# Patient Record
Sex: Female | Born: 1970 | Race: White | Hispanic: No | Smoking: Former smoker
Health system: Southern US, Community
[De-identification: ages and names within clinical notes are randomized; demographics above are authoritative.]

## PROBLEM LIST (undated history)

## (undated) DIAGNOSIS — O1423 HELLP syndrome (HELLP), third trimester: Secondary | ICD-10-CM

## (undated) DIAGNOSIS — O24419 Gestational diabetes mellitus in pregnancy, unspecified control: Secondary | ICD-10-CM

## (undated) HISTORY — DX: HELLP syndrome (HELLP), third trimester: O14.23

## (undated) HISTORY — DX: Gestational diabetes mellitus in pregnancy, unspecified control: O24.419

---

## 2006-11-15 ENCOUNTER — Inpatient Hospital Stay (HOSPITAL_COMMUNITY): Admission: AD | Admit: 2006-11-15 | Discharge: 2006-11-19 | Payer: Self-pay | Admitting: Obstetrics and Gynecology

## 2006-12-20 ENCOUNTER — Ambulatory Visit: Payer: Self-pay | Admitting: Obstetrics & Gynecology

## 2006-12-27 ENCOUNTER — Ambulatory Visit: Payer: Self-pay | Admitting: Obstetrics & Gynecology

## 2007-01-03 ENCOUNTER — Ambulatory Visit: Payer: Self-pay | Admitting: Gynecology

## 2007-01-10 ENCOUNTER — Ambulatory Visit: Payer: Self-pay | Admitting: Obstetrics and Gynecology

## 2007-01-14 ENCOUNTER — Inpatient Hospital Stay (HOSPITAL_COMMUNITY): Admission: AD | Admit: 2007-01-14 | Discharge: 2007-01-18 | Payer: Self-pay | Admitting: Obstetrics and Gynecology

## 2007-01-15 ENCOUNTER — Encounter (INDEPENDENT_AMBULATORY_CARE_PROVIDER_SITE_OTHER): Payer: Self-pay | Admitting: Obstetrics and Gynecology

## 2007-01-31 ENCOUNTER — Ambulatory Visit: Admission: RE | Admit: 2007-01-31 | Discharge: 2007-01-31 | Payer: Self-pay | Admitting: Obstetrics and Gynecology

## 2008-04-08 IMAGING — US US OB TRANSVAGINAL
1 series · 14 of 16 positions shown · non-contrast
Comparison: none

OBSTETRICAL ULTRASOUND:

 This ultrasound exam was performed in the [HOSPITAL] Ultrasound Department.  The OB US report was generated in the AS system, and faxed to the ordering physician.  This report is also available in [REDACTED] PACS.

[Series 1: us ob transvaginal · 0.35mm/px · 14 of 16 slices shown]
[im 1/16]
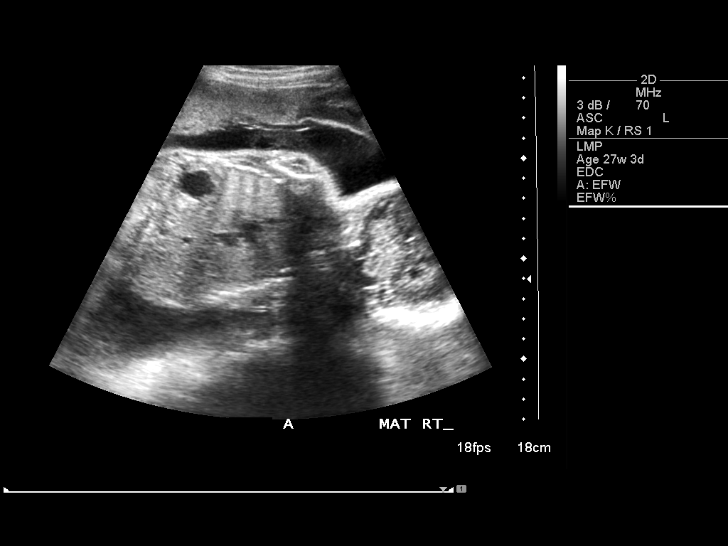
[im 2/16]
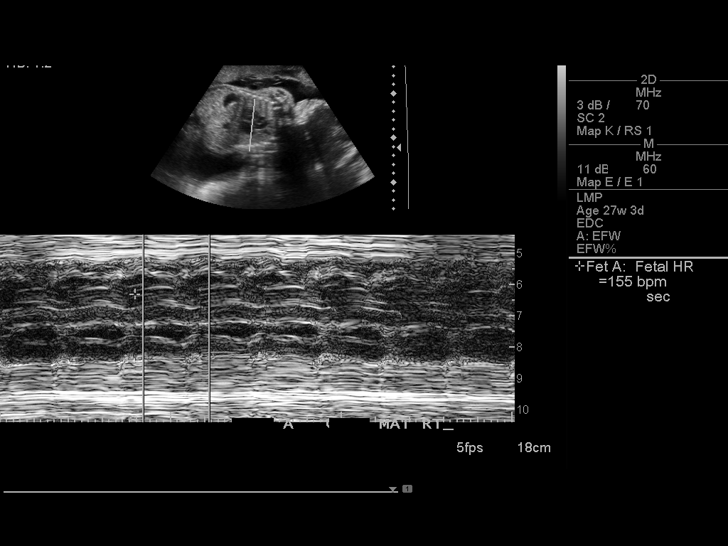
[im 3/16]
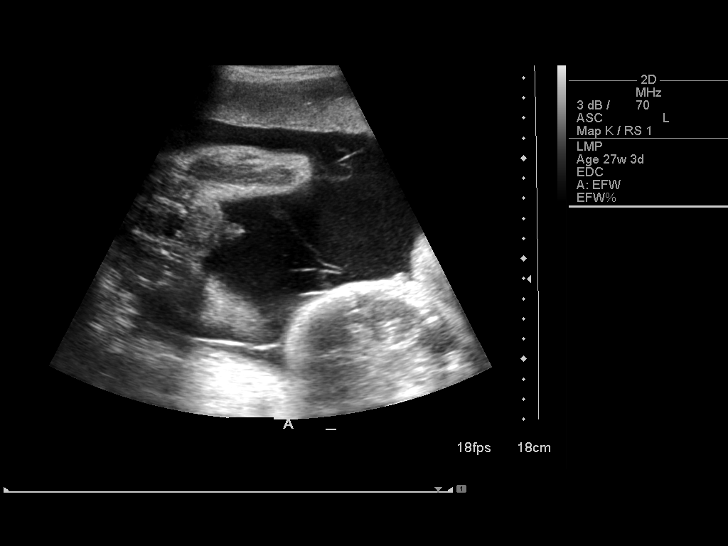
[im 5/16]
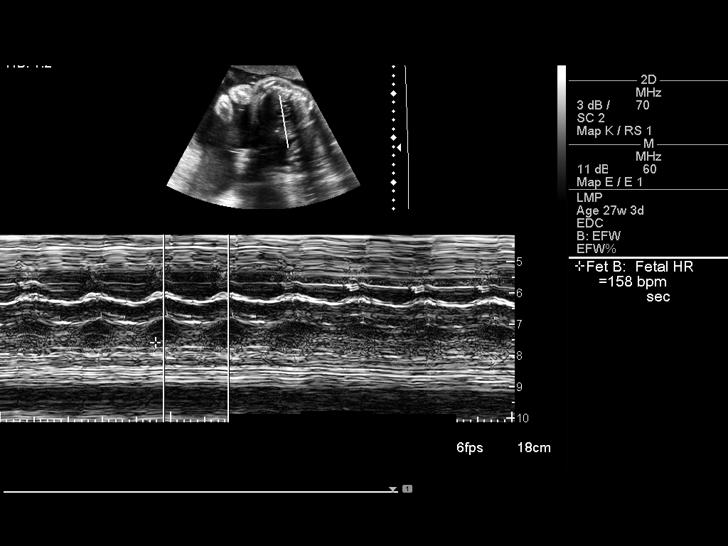
[im 6/16]
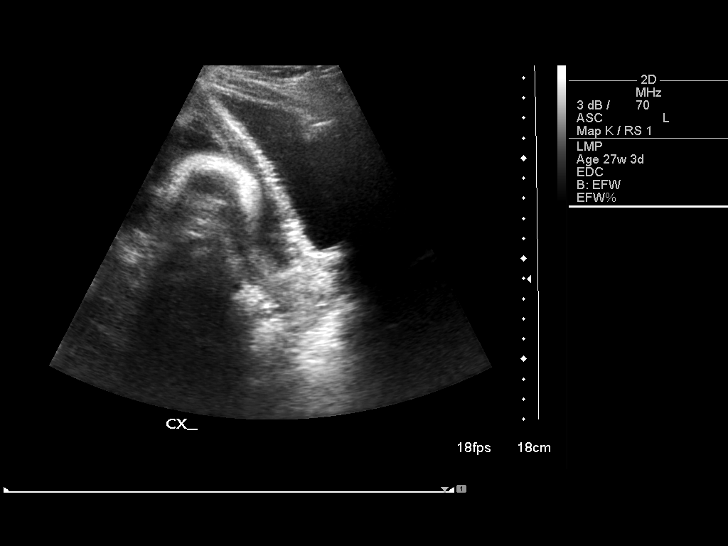
[im 7/16]
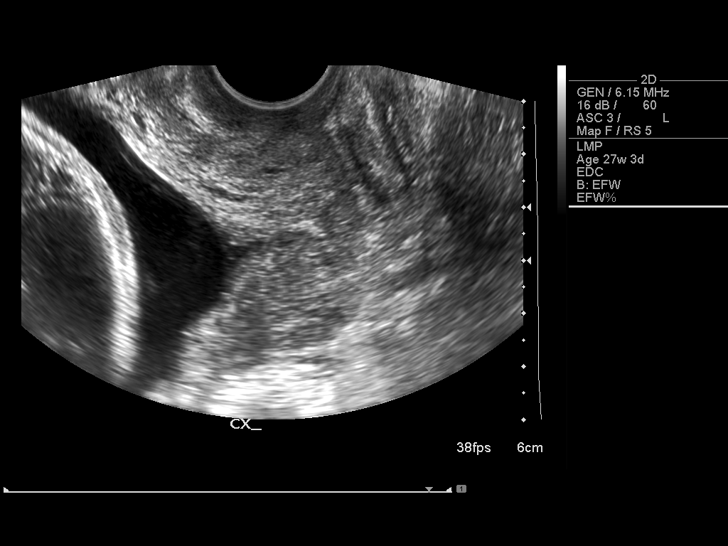
[im 8/16]
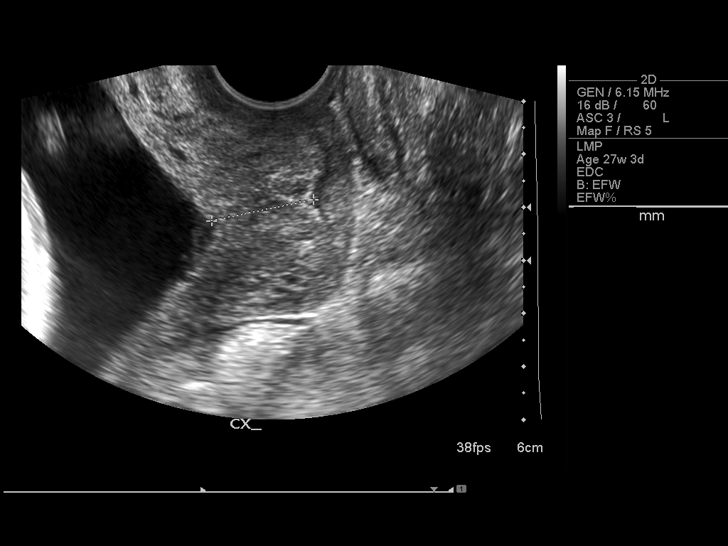
[im 9/16]
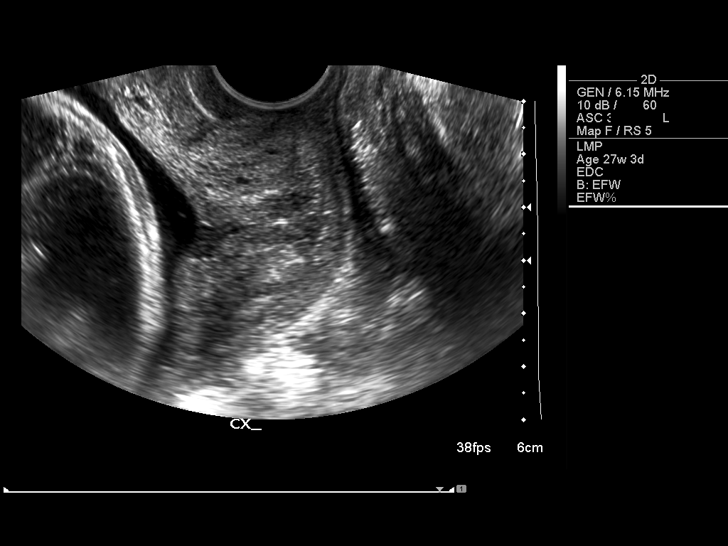
[im 10/16]
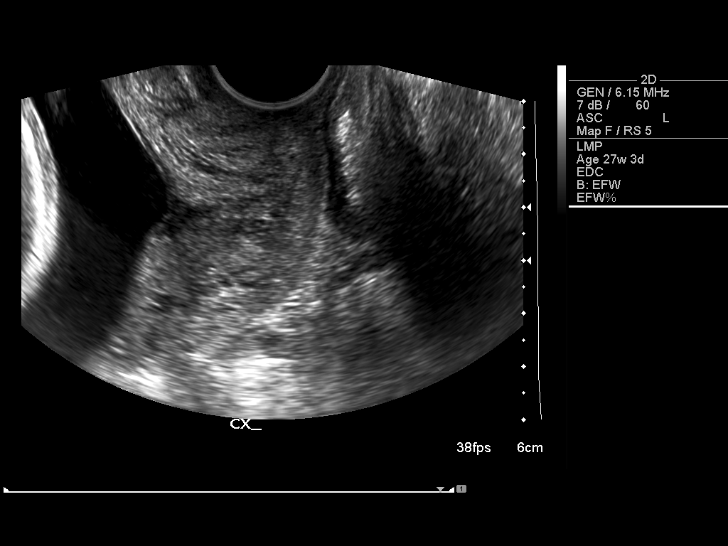
[im 11/16]
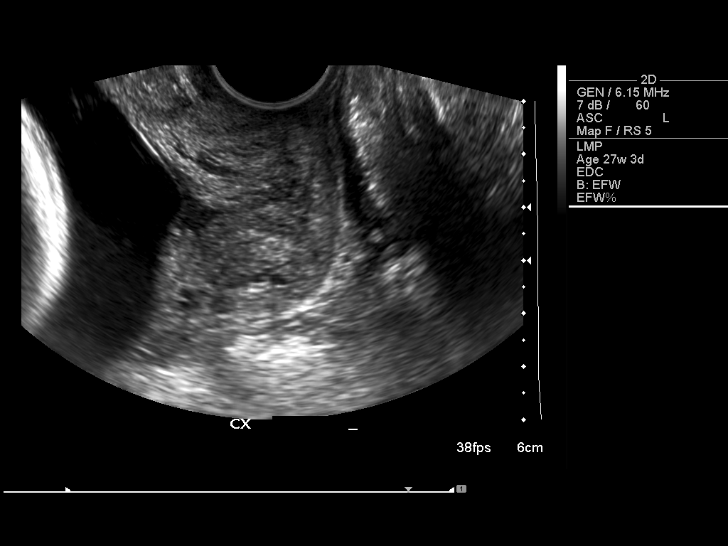
[im 13/16]
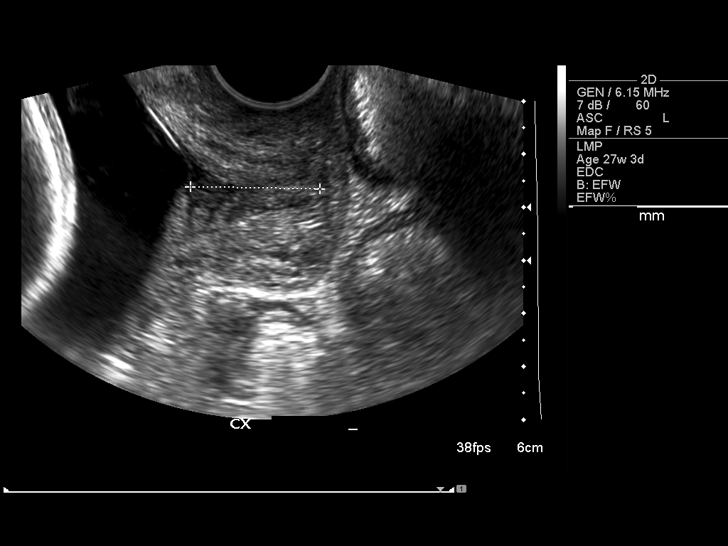
[im 14/16]
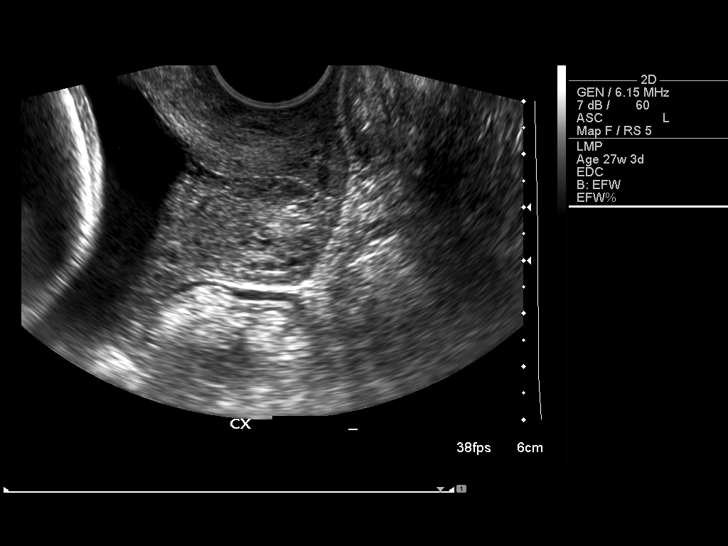
[im 15/16]
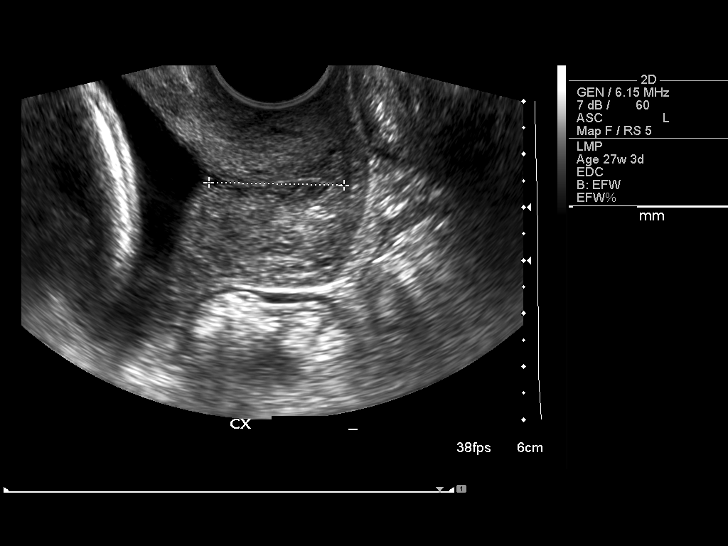
[im 16/16]
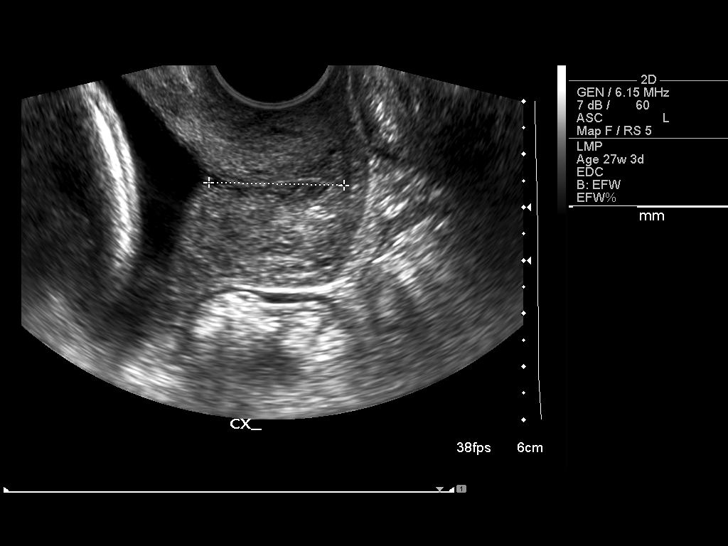

[14 of 16 positions shown; findings below may reference images not displayed]

IMPRESSION: See AS Obstetric US report.

## 2010-10-13 NOTE — Discharge Summary (Signed)
NAME:  Christina Lambert, Christina Lambert            ACCOUNT NO.:  [id number]   MEDICAL RECORD NO.:  [id number]          PATIENT TYPE:  INP   LOCATION:  9158                          FACILITY:  [facility]   PHYSICIAN:  Michelle L. Grewal, M.D.DATE OF BIRTH:  May 31, 1971   DATE OF ADMISSION:  11/15/2006  DATE OF DISCHARGE:  11/19/2006                               DISCHARGE SUMMARY   ADMITTING DIAGNOSES:  1. Intrauterine pregnancy at 27-3/7 weeks, estimated gestational age.  2. Twin gestation.  3. Pre-term labor.   DISCHARGE DIAGNOSES:  1. Intrauterine pregnancy at 27-5/7 weeks estimated gestational age.  2. Twin gestation.  3. Pre-term labor, tocolysis.   REASON FOR ADMISSION:  Please see dictated H&P.   HOSPITAL COURSE:  The patient was a 40 year old gravida 2, para 0-0-1-0,  that was admitted to [facility] at 27-3/7 weeks  estimated gestational age with a twin gestation.  The patient had been  seen in the office for routine visit and was noted that the external os  was dilated to 2-3 cm.  The patient was then sent to the maternity  admissions for further monitoring.  On the fetal monitor, the patient  was noted to have some increase in uterine activity.  She was  administered 0.25 mg of terbutaline subcutaneously, while waiting to get  cervical length assessment.  Cervical length was noted to be 2.5 cm,  which was decreased from the previous ultrasound scanning that had  revealed cervix at 5.1 cm.  The patient was now admitted for observation  and tocolysis and betamethasone administration.  On the following  morning, the patient was doing well.  No further contractions were noted  on the monitor.  Fetal movement was observed, and fetal heart tones were  reactive.  Ultrasound had revealed vertex breech presentation.  Decision  was made to perform fetal fibronectin, continue with betamethasone  administration, if no further contractions.  Plan was to discharge the  patient home.  On the  following morning, the patient was without  complaint.  She was able to feel some uterine contractions.  Vital signs  were stable.  Fetal heart tones were reactive.  Contractions:  None had  been seen throughout the night.  However, the patient had been observed  10 over the last several hours.  Group B beta strep culture had been  performed.  However, we were waiting for results.  The patient was  completed.  Betamethasone administration was changed from terbutaline to  Procardia, due to some was associated tachycardia.  One-hour GTT was  performed which was abnormal, and now 3-hour GTT was scheduled.  On the  following morning, the patient was doing well, no contractions were  observed, fetal heart tones were reactive, uterus was nontender.  A 3-  hour glucose tolerance test was scheduled.  On the following morning,  the patient was without complaint.  Vital signs are stable.  Fetal heart  tones were reactive.  No further contractions were noted, and the  patient was later discharged home.   CONDITION ON DISCHARGE:  Stable.   DIET:  Regular as tolerated.   FOLLOW UP:  Patient to follow up in the office in 2 to 3 days for an OB  check.  She is to call for increase in pelvic pressure contractions,  rupture of membranes, vaginal bleeding or decrease in fetal movement.   DISCHARGE MEDICATIONS:  1. Procardia 10 mg every 6 hours.  2. Ambien 10 mg one p.o. at h.s. p.r.n.  3. Prenatal vitamins one p.o. daily.      Julio Sicks, N.P.      Stann Mainland. Vincente Poli, M.D.  Electronically Signed    CC/MEDQ  D:  12/23/2006  T:  12/23/2006  Job:  [id number]

## 2010-10-13 NOTE — Op Note (Signed)
NAME:  Christina Lambert, Christina Lambert NO.:  [id number]   MEDICAL RECORD NO.:  [id number]          PATIENT TYPE:  INP   LOCATION:  9374                          FACILITY:  [facility]   PHYSICIAN:  Juluis Mire, M.D.   DATE OF BIRTH:  09-26-1970   DATE OF PROCEDURE:  01/15/2007  DATE OF DISCHARGE:  11/19/2006                               OPERATIVE REPORT   PREOPERATIVE DIAGNOSIS:  Twin pregnancy 36 weeks with a development of  HELLP syndrome.  The infant's in the vertex transverse lie.   POSTOPERATIVE DIAGNOSIS:  Twin pregnancy 36 weeks with a development of  HELLP syndrome.  The infant's in the vertex transverse lie.   PROCEDURES:  Primary low transverse cesarean section.   SURGEON:  Juluis Mire, M.D.   ANESTHESIA:  Spinal.   ESTIMATED BLOOD LOSS:  700 mL.   PACKS AND DRAINS:  None.   BLOOD REPLACED:  None.   COMPLICATIONS:  None.   INDICATIONS:  A 40 year old female with twin pregnancies at 54 weeks.  She reported on the evening of 16th with bleeding after intercourse.  Evaluation revealed minimal bleeding but enough bloody show that she was  kept overnight. She had no progressive uterine activity. A CBC was  obtained which revealed a low platelet count of 107,000.  It was  repeated and did drop to 90,000.  We did have PIH panel that revealed  elevated SGOT, SGPT and LDH consistent with HELLP syndrome.  In view of  this, ultrasound was performed.  The infant's were the vertex transverse  lie.  In view of the lowering platelet count and the HELLP syndrome with  the fetal position, decision was to proceed with primary cesarean  section.  The risks were explained including the risk of infection.  The  risk of hemorrhage that could require transfusion with the risk of AIDS  or hepatitis.  The risk of injury to adjacent organs including bladder,  bowel, ureters that could require further exploratory surgery.  Risk of  deep venous thrombosis and pulmonary embolus.  The risk  of fetal  pulmonary immaturity also explained.   DESCRIPTION OF PROCEDURE:  The patient was taken to OR, placed supine  position with left lateral tilt.  After satisfactory level spinal  anesthesia was obtained, the abdomen was prepped out Betadine and draped  as a sterile field.  A low transverse skin incision made knife and  carried through subcutaneous tissue.  Fascia was entered sharply,  incision fascia extended laterally.  Fascia taken off the muscle  superiorly inferiorly.  Rectus muscles were separated midline.  The  anterior peritoneum was identified and entered sharply, incision of  peritoneum extended both superiorly inferiorly.  A low transverse  bladder flap was developed, a low transverse uterine incision begun with  a knife and extended laterally using manual traction. Twin A presented  vertex presentation and had clear amniotic fluid, was delivered  elevation head and fundal pressure. The infant was viable female weighing  5 pounds 15 ounces Apgars were eight and nine, umbilical artery pH was  7.35.  Twin B was transverse was converted to  a vertex presentation and  the amniotic fluid was clear and was delivered elevation of the head and  fundal pressure and was a viable female weighing 5 pounds 4 ounces Apgars  were 8/9 umbilical artery pH was 7.31.  Placenta was delivered manually  and sent for pathological review.  Uterus then closed with running  locking suture of 0 chromic using a two-layer closure technique.  We had  good hemostasis.  Tubes and ovaries were unremarkable.  We thoroughly  irrigated the pelvis and again had good hemostasis.  Muscles and  peritoneum closed en bloc with a running suture of 3-0 Vicryl.  Fascia  closed with a suture of 0 PDS, subcu with a running suture of 2-0  chromic.  Skin was closed staples and Steri-Strips.  Sponge, instrument  and needle count was correct by circulating nurse x2.  Foley catheter  remained clear at the time of closure.   The patient did tolerate the  procedure well and was returned to the recovery room in good condition.      Juluis Mire, M.D.  Electronically Signed     JSM/MEDQ  D:  01/15/2007  T:  01/15/2007  Job:  [id number]

## 2010-10-13 NOTE — H&P (Signed)
NAME:  Christina Lambert, Christina Lambert NO.:  [id number]   MEDICAL RECORD NO.:  [id number]          PATIENT TYPE:  INP   LOCATION:  9158                          FACILITY:  [facility]   PHYSICIAN:  Duke Salvia. Marcelle Overlie, M.D.DATE OF BIRTH:  Jun 12, 1970   DATE OF ADMISSION:  11/15/2006  DATE OF DISCHARGE:                              HISTORY & PHYSICAL   CHIEF COMPLAINT:  Preterm labor.   HISTORY OF PRESENT ILLNESS:  A 40 year old G2, P0-0-1-0, at 27-3/7  weeks, who was seen in the office today for a routine visit and it was  noted that the external os was 2-3 cm dilated.  Was sent to [facility] for  monitoring, where she did so with some increased uterine activity.  She  was given 0.25 mg of terbutaline subcutaneously while we were waiting to  get a cervical length assessment.  FFN was not done due to the digital  exam.  Cervical length Oct 06, 2006, was 5.1, today was 2.5 cm.  The  decision made to admit for observation and tocolysis along with  betamethasone.   Blood type is A positive.  Her one-hour GTT has not been done yet.  Her  last ultrasound she was being followed for a low-lying placenta.   PAST MEDICAL HISTORY:  Allergies:  None.   Obstetrical history:  One TAB in 1995.   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   FAMILY HISTORY:  Please see her [facility] form.   PHYSICAL EXAMINATION:  VITAL SIGNS:  Temperature 98.3, blood pressure  120/78.  HEENT:  Unremarkable.  NECK:  Supple without masses.  LUNGS:  Clear.  CARDIOVASCULAR:  Regular rate and rhythm without murmurs, rubs or  gallops noted.  BREASTS:  Not examined.  ABDOMEN:  Fundal height was 36 cm.  PELVIC:  Cervix:  External os 2-3, internal os closed.  EXTREMITIES:  Unremarkable.  NEUROLOGIC:  Unremarkable.   IMPRESSION:  1. Twenty-seven and three-sevenths week twin gestation.  2. Low-lying placenta.  3. Preterm labor.   PLAN:  We will admit for betamethasone and  tocolysis.      Richard M. Marcelle Overlie, M.D.  Electronically Signed     RMH/MEDQ  D:  11/15/2006  T:  11/15/2006  Job:  [id number]

## 2010-10-16 NOTE — Discharge Summary (Signed)
NAME:  Christina Lambert, Christina Lambert NO.:  [id number]   MEDICAL RECORD NO.:  [id number]          PATIENT TYPE:  INP   LOCATION:                                FACILITY:  [facility]   PHYSICIAN:  Zelphia Cairo, MD    DATE OF BIRTH:  01-Apr-1971   DATE OF ADMISSION:  01/14/2007  DATE OF DISCHARGE:  01/18/2007                               DISCHARGE SUMMARY   ADMITTING DIAGNOSES:  Intrauterine pregnancy at 51 weeks, estimated  gestational age.  1. Twin gestation.  2. HELLP syndrome.   DISCHARGE DIAGNOSES:  1. Status post low transverse cesarean section.  2. Viable female twin infants.  3. HELLP syndrome, resolved.   PROCEDURE:  Primary low transverse cesarean section.   REASON FOR ADMISSION:  Please see written H&P.   HOSPITAL COURSE:  The patient is 40 year old female that was admitted to  [facility] at 36 weeks estimated gestational age with  known twin gestation.  The patient had reported on the evening of the  August 16, she had some bleeding after intercourse.  Evaluation revealed  minimal bleeding, however, enough bloody show that she was kept  overnight for observation.  The patient had no progressive uterine  activity.  A CBC was obtained which had revealed a low platelet count of  107,000.  The patient did have a CBC repeated which revealed lowering of  platelet count down to 90,000.  PIH panel was drawn which had revealed  an elevated SGOT and SGPT and LDH which was consistent with HELLP  syndrome.  In view of this, decision was made to proceed with a primary  low transverse cesarean section.  The patient was then taken to the  operating room where spinal anesthesia was administered without  difficulty.  A low transverse incision was made with delivery of a  viable twin A female infant weighing 5 pounds 14 ounces with Apgars of 9  at 1 minute and 9 at 5 minutes.  Arterial cord pH was 7.34.  Also, twin  B, also female, weighing 5 pounds 5 ounces with Apgars of  9 at 1 minute  and 9 at 5 minutes.  Arterial cord pH was 7.31.  The patient was  transferred to the recovery room in stable condition.  The patient was  started on magnesium sulfate.  Blood pressure was 130s/60s to 80s,  output had been approximately 250 mL over the last several hours.  Deep  tendon reflexes were 2+.  Fundus was firm and nontender.  Abdominal  dressing was noted to be clean, dry and intact.  Platelet count was up  to 75,000 and liver function tests seemed to be decreasing.  The patient  continued on magnesium sulfate and PIH.  Labs were ordered for later in  the evening.  Later that evening, the patient did complained of some  blurred vision.  Vital signs were stable.  Blood pressure stable.  Liver  function tests were improving.  Output was now 250-650 mL per hour.  The  patient was noted to be diuresing and magnesium sulfate was discontinued  and PIH labs were ordered  for the following morning.  On postoperative  day #2, the patient denied any headache or blurred vision or right upper  quadrant pain.  Vital signs were stable.  Blood pressure was within  normal limits.  Lungs were clear to auscultation.  Abdomen: Soft.  Fundus firm and nontender.  Deep tendon reflexes were 3+ without clonus.  Laboratory findings revealed hemoglobin of 8.3, platelet count was up to  117,000, creatinine was 0.98, AST was now 58, ALT was 92.  Uric acid was  9.0.  The patient was later transferred to the mother baby unit.  On  postoperative day #3, the patient denied headache, blurred vision or  right upper quadrant pain.  Vital signs were stable.  She was afebrile.  Blood pressure was 119/81 to 124/83.  Deep tendon reflexes 2+.  Abdomen  soft.  Fundus firm and nontender.  Abdominal dressing was removed  revealing incision that was clean, dry and intact.  Staples were  subsequently removed.  Laboratory findings showed hemoglobin of 8.1,  platelet count 137,000, WBC count of 11.4, AST was 48,  ALT was 69,  alkaline phosphatase was 107.  Discharge instructions were reviewed and  the patient was later discharged home.   CONDITION ON DISCHARGE:  Stable.   DISCHARGE INSTRUCTIONS:  1. Diet regular as tolerated.  2. Activity:  No heavy lifting, no driving x2 weeks, no vaginal entry.   FOLLOW UP:  Patient to follow up in the office in 2 days for blood  pressure check and PIH labs.  She is to call for temperature greater  than 100 degrees, persistent nausea and vomiting, heavy vaginal bleeding  and/or redness or drainage from incisional site.  The patient is also  encouraged to call for headache, blurred vision or right upper quadrant  pain.   DISCHARGE MEDICATIONS:  1. Percocet 5/325 #30 one p.o. q.4-6 h.  2. Motrin 600 mg q.6 h.  3. Prenatal vitamins one p.o. daily.  4. Colace 1 p.o. daily p.r.n.      Julio Sicks, N.P.      Zelphia Cairo, MD  Electronically Signed    CC/MEDQ  D:  02/22/2007  T:  02/22/2007  Job:  [id number]

## 2011-03-12 LAB — COMPREHENSIVE METABOLIC PANEL
ALT: 127 — ABNORMAL HIGH
ALT: 183 — ABNORMAL HIGH
ALT: 211 — ABNORMAL HIGH
AST: 122 — ABNORMAL HIGH
AST: 166 — ABNORMAL HIGH
AST: 92 — ABNORMAL HIGH
Albumin: 1.7 — ABNORMAL LOW
Albumin: 1.7 — ABNORMAL LOW
Albumin: 1.8 — ABNORMAL LOW
Albumin: 2.1 — ABNORMAL LOW
Alkaline Phosphatase: 107
Alkaline Phosphatase: 129 — ABNORMAL HIGH
Alkaline Phosphatase: 146 — ABNORMAL HIGH
BUN: 5 — ABNORMAL LOW
BUN: 5 — ABNORMAL LOW
BUN: 6
BUN: 7
CO2: 21
Calcium: 7.5 — ABNORMAL LOW
Calcium: 8 — ABNORMAL LOW
Calcium: 8.3 — ABNORMAL LOW
Calcium: 8.5
Chloride: 100
Chloride: 104
Chloride: 109
Creatinine, Ser: 0.84
Creatinine, Ser: 0.97
Creatinine, Ser: 0.98
Creatinine, Ser: 1
Creatinine, Ser: 1.11
GFR calc Af Amer: >60
GFR calc Af Amer: >60
GFR calc Af Amer: >60
GFR calc non Af Amer: >60
Glucose, Bld: 80
Glucose, Bld: 97
Potassium: 3.6
Potassium: 3.7
Potassium: 3.9
Sodium: 133 — ABNORMAL LOW
Sodium: 135
Total Bilirubin: 0.5
Total Bilirubin: 0.6
Total Bilirubin: 0.8
Total Bilirubin: 0.8
Total Protein: 4 — ABNORMAL LOW
Total Protein: 4.3 — ABNORMAL LOW
Total Protein: 4.3 — ABNORMAL LOW
Total Protein: 4.5 — ABNORMAL LOW
Total Protein: 5 — ABNORMAL LOW

## 2011-03-12 LAB — CBC
HCT: 23.4 — ABNORMAL LOW
HCT: 24 — ABNORMAL LOW
HCT: 25.3 — ABNORMAL LOW
Hemoglobin: 10.4 — ABNORMAL LOW
Hemoglobin: 8.3 — ABNORMAL LOW
MCHC: 34.5
MCHC: 35.6
MCHC: 35.6
MCHC: 35.7
MCV: 88.3
MCV: 88.6
MCV: 88.9
MCV: 89.9
Platelets: 107 — ABNORMAL LOW
Platelets: 117 — ABNORMAL LOW
Platelets: 137 — ABNORMAL LOW
Platelets: 75 — ABNORMAL LOW
Platelets: 90 — ABNORMAL LOW
RBC: 3.32 — ABNORMAL LOW
RBC: 3.75 — ABNORMAL LOW
RDW: 14.6 — ABNORMAL HIGH
RDW: 14.7 — ABNORMAL HIGH
RDW: 14.7 — ABNORMAL HIGH
RDW: 14.8 — ABNORMAL HIGH
RDW: 15 — ABNORMAL HIGH
WBC: 13 — ABNORMAL HIGH
WBC: 14.7 — ABNORMAL HIGH
WBC: 8.6

## 2011-03-12 LAB — URIC ACID
Uric Acid, Serum: 7.6 — ABNORMAL HIGH
Uric Acid, Serum: 9 — ABNORMAL HIGH
Uric Acid, Serum: 9.3 — ABNORMAL HIGH

## 2011-03-12 LAB — LACTATE DEHYDROGENASE
LDH: 203
LDH: 296 — ABNORMAL HIGH
LDH: 430 — ABNORMAL HIGH

## 2011-03-12 LAB — MAGNESIUM
Magnesium: 2
Magnesium: 6.5
Magnesium: 6.6

## 2011-03-12 LAB — RPR: RPR Ser Ql: NONREACTIVE

## 2011-03-17 LAB — GLUCOSE, 3 HOUR GESTATIONAL: Glucose, GTT - 3 Hour: 182 — ABNORMAL HIGH

## 2011-03-17 LAB — STREP B DNA PROBE: Strep Group B Ag: NEGATIVE

## 2020-03-03 LAB — HM HIV SCREENING LAB: HM HIV Screening: NEGATIVE

## 2020-03-03 LAB — OB RESULTS CONSOLE GC/CHLAMYDIA: Chlamydia: NEGATIVE

## 2020-03-03 LAB — HEPATITIS B SURFACE ANTIGEN: Hepatitis B Surface Ag: NEGATIVE

## 2020-04-28 LAB — HM MAMMOGRAPHY

## 2020-08-26 ENCOUNTER — Encounter: Payer: Self-pay | Admitting: Legal Medicine

## 2020-08-26 ENCOUNTER — Ambulatory Visit (INDEPENDENT_AMBULATORY_CARE_PROVIDER_SITE_OTHER): Payer: Managed Care, Other (non HMO) | Admitting: Legal Medicine

## 2020-08-26 VITALS — BP 120/80 | HR 63 | Temp 98.7°F | Resp 16 | Ht 62.0 in | Wt 150.0 lb

## 2020-08-26 DIAGNOSIS — J028 Acute pharyngitis due to other specified organisms: Secondary | ICD-10-CM

## 2020-08-26 DIAGNOSIS — Z9189 Other specified personal risk factors, not elsewhere classified: Secondary | ICD-10-CM | POA: Diagnosis not present

## 2020-08-26 DIAGNOSIS — R6889 Other general symptoms and signs: Secondary | ICD-10-CM | POA: Diagnosis not present

## 2020-08-26 DIAGNOSIS — J029 Acute pharyngitis, unspecified: Secondary | ICD-10-CM | POA: Insufficient documentation

## 2020-08-26 LAB — POCT RAPID STREP A (OFFICE): Rapid Strep A Screen: NEGATIVE

## 2020-08-26 LAB — POCT INFLUENZA A/B
Influenza A, POC: NEGATIVE
Influenza B, POC: NEGATIVE

## 2020-08-26 LAB — POC COVID19 BINAXNOW: SARS Coronavirus 2 Ag: NEGATIVE

## 2020-08-26 MED ORDER — AZITHROMYCIN 250 MG PO TABS: ORAL_TABLET | ORAL | 0 refills | Status: DC

## 2020-08-26 NOTE — Progress Notes (Signed)
Subjective:  Patient ID: Christina Lambert, female    DOB: February 09, 1971  Age: 50 y.o. MRN: [id number]  Chief Complaint  Patient presents with  . Sore Throat    Since yesterday morning  . Fatigue  . Headache  . Generalized Body Aches    HPI: sick for one day. Feels aching  And fever.  Barbara Cower has strep.  Chills, had flu shot.   Current Outpatient Medications on File Prior to Visit  Medication Sig Dispense Refill  . levonorgestrel (MIRENA) 20 MCG/24HR IUD by Intrauterine route.     No current facility-administered medications on file prior to visit.   Past Medical History:  Diagnosis Date  . Gestational diabetes   . HELLP syndrome in third trimester    Past Surgical History:  Procedure Laterality Date  . CESAREAN SECTION  01/15/2007    Family History  Problem Relation Age of Onset  . Bladder Cancer Father   . Breast cancer Sister   . Diabetes Mellitus I Other    Social History   Socioeconomic History  . Marital status: Divorced    Spouse name: Not on file  . Number of children: 2  . Years of education: Not on file  . Highest education level: Some college, no degree  Occupational History  . Occupation: Case worker  Tobacco Use  . Smoking status: Former Smoker    Types: Cigarettes    Quit date: 06/2019    Years since quitting: 1.2  . Smokeless tobacco: Never Used  Vaping Use  . Vaping Use: Never used  Substance and Sexual Activity  . Alcohol use: Not Currently  . Drug use: Never  . Sexual activity: Yes    Partners: Male    Birth control/protection: I.U.D.  Other Topics Concern  . Not on file  Social History Narrative  . Not on file   Social Determinants of Health   Financial Resource Strain: Not on file  Food Insecurity: Not on file  Transportation Needs: Not on file  Physical Activity: Not on file  Stress: Not on file  Social Connections: Not on file    Review of Systems  Constitutional: Negative for activity change and appetite change.  HENT:  Positive for congestion.   Eyes: Negative for visual disturbance.  Respiratory: Negative for chest tightness and shortness of breath.   Cardiovascular: Negative for chest pain, palpitations and leg swelling.  Gastrointestinal: Negative for abdominal distention and abdominal pain.  Endocrine: Negative for polyuria.  Genitourinary: Negative.   Musculoskeletal: Positive for myalgias.  Skin: Negative.   Neurological: Positive for headaches.  Psychiatric/Behavioral: Negative.      Objective:  BP 120/80   Pulse 63   Temp 98.7 F (37.1 C)   Resp 16   Ht 5\' 2"  (1.575 m)   Wt 150 lb (68 kg)   BMI 27.44 kg/m   BP/Weight 08/26/2020  Systolic BP 120  Diastolic BP 80  Wt. (Lbs) 150  BMI 27.44    Physical Exam Vitals reviewed.  Constitutional:      Appearance: She is well-developed.  HENT:     Head: Normocephalic and atraumatic.     Right Ear: Tympanic membrane normal.     Left Ear: Tympanic membrane normal.     Mouth/Throat:     Mouth: Mucous membranes are moist. No oral lesions.     Pharynx: No pharyngeal swelling, oropharyngeal exudate or posterior oropharyngeal erythema.  Eyes:     Conjunctiva/sclera: Conjunctivae normal.     Pupils: Pupils are equal,  round, and reactive to light.  Cardiovascular:     Rate and Rhythm: Normal rate and regular rhythm.     Heart sounds: Normal heart sounds.  Pulmonary:     Effort: Pulmonary effort is normal. No respiratory distress.     Breath sounds: No rales.  Abdominal:     General: Bowel sounds are normal.     Palpations: Abdomen is soft.  Musculoskeletal:     Cervical back: Normal range of motion and neck supple.  Skin:    General: Skin is warm.     Capillary Refill: Capillary refill takes less than 2 seconds.  Neurological:     General: No focal deficit present.     Mental Status: She is alert.       Lab Results  Component Value Date   WBC 11.4 (H) 01/18/2007   HGB 8.1 (L) 01/18/2007   HCT 23.4 (L) 01/18/2007   PLT 137  (L) 01/18/2007   GLUCOSE 88 01/18/2007   ALT 69 (H) 01/18/2007   AST 48 (H) 01/18/2007   NA 137 01/18/2007   K 3.7 01/18/2007   CL 107 01/18/2007   CREATININE 0.84 01/18/2007   BUN 7 01/18/2007   CO2 25 01/18/2007      Assessment & Plan:   Diagnoses and all orders for this visit: Pharyngitis due to other organism -     Rapid Strep A- negative -     azithromycin (ZITHROMAX) 250 MG tablet; 2 tablets on day 1, then 1 tablet daily on days 2-6.  Flu-like symptoms -     Influenza A/B- negative  At increased risk of exposure to COVID-19 virus -     POC COVID-19- negative         I spent 20 minutes dedicated to the care of this patient on the date of this encounter to include face-to-face time with the patient, as well as:   Follow-up: No follow-ups on file.  An After Visit Summary was printed and given to the patient.  Brent Bulla, MD [facility] [phone]

## 2020-08-26 NOTE — Progress Notes (Signed)
Negative flu A and B lp

## 2020-12-17 ENCOUNTER — Encounter: Payer: Self-pay | Admitting: Legal Medicine

## 2021-09-07 ENCOUNTER — Ambulatory Visit (INDEPENDENT_AMBULATORY_CARE_PROVIDER_SITE_OTHER): Payer: Managed Care, Other (non HMO) | Admitting: Legal Medicine

## 2021-09-07 ENCOUNTER — Encounter: Payer: Self-pay | Admitting: Legal Medicine

## 2021-09-07 VITALS — BP 110/70 | HR 97 | Temp 98.3°F | Resp 16 | Wt 141.6 lb

## 2021-09-07 DIAGNOSIS — F332 Major depressive disorder, recurrent severe without psychotic features: Secondary | ICD-10-CM

## 2021-09-07 DIAGNOSIS — M545 Low back pain, unspecified: Secondary | ICD-10-CM

## 2021-09-07 LAB — POCT URINALYSIS DIP (CLINITEK)
Bilirubin, UA: NEGATIVE
Glucose, UA: NEGATIVE mg/dL
Leukocytes, UA: NEGATIVE
Nitrite, UA: NEGATIVE
POC PROTEIN,UA: NEGATIVE
Spec Grav, UA: 1.015 (ref 1.010–1.025)
Urobilinogen, UA: 0.2 E.U./dL
pH, UA: 6 (ref 5.0–8.0)

## 2021-09-07 MED ORDER — CITALOPRAM HYDROBROMIDE 20 MG PO TABS: 20.0000 mg | ORAL_TABLET | Freq: Every day | ORAL | 3 refills | Status: DC

## 2021-09-07 MED ORDER — ARIPIPRAZOLE 2 MG PO TABS: 2.0000 mg | ORAL_TABLET | Freq: Every day | ORAL | 2 refills | Status: DC

## 2021-09-07 NOTE — Progress Notes (Signed)
? ?Acute Office Visit ? ?Subjective:  ? ? Patient ID: Christina Lambert, female    DOB: [birth year], 51 y.o.   MRN: [id number] ? ?Chief Complaint  ?Patient presents with  ? Depression  ? ? ?HPI: Acute ?Patient is in today for Depression, she has feeling bad for 6 months.  Getting more depression and children suspended.. work Doctor, general practice that went to her home.  She is having suicidal thoughts.but no plan. Boyfriend helpful. Anhedonia.  [place] police called and brought her to ER.  No mental health evaluation. Using CBD daily for long time, stopped last week.  She needs counselor. She did well on celexa in past and saw Myriam Jacobson keys in past. Sister terminal with breast cancer. ?Past Medical History:  ?Diagnosis Date  ? Gestational diabetes   ? 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 ? ?Past Surgical History:  ?Procedure Laterality Date  ? CESAREAN SECTION  01/15/2007  ? ? ?Family History  ?Problem Relation Age of Onset  ? Bladder Cancer Father   ? Breast cancer Sister   ? Diabetes Mellitus I Other   ? ? ?Social History  ? ?Socioeconomic History  ? Marital status: Divorced  ?  Spouse name: Not on file  ? Number of children: 2  ? Years of education: Not on file  ? Highest education level: Some college, no degree  ?Occupational History  ? Occupation: Case worker  ?Tobacco Use  ? Smoking status: Former  ?  Types: Cigarettes  ?  Quit date: 06/2019  ?  Years since quitting: 2.2  ? Smokeless tobacco: Never  ?Vaping Use  ? Vaping Use: Never used  ?Substance and Sexual Activity  ? Alcohol use: Not Currently  ? Drug use: Never  ? Sexual activity: Yes  ?  Partners: Male  ?  Birth control/protection: I.U.D.  ?Other Topics Concern  ? Not on file  ?Social History Narrative  ? Not on file  ? ?Social Determinants of Health  ? ?Financial Resource Strain: Not on file  ?Food Insecurity: Not on file  ?Transportation Needs: Not on file  ?Physical Activity: Not on file  ?Stress: Not on file  ?Social Connections: Not on file  ?Intimate Partner  Violence: Not on file  ? ? ?Outpatient Medications Prior to Visit  ?Medication Sig Dispense Refill  ? levonorgestrel (MIRENA) 20 MCG/24HR IUD by Intrauterine route.    ? azithromycin (ZITHROMAX) 250 MG tablet 2 tablets on day 1, then 1 tablet daily on days 2-6. (Patient not taking: Reported on 09/07/2021) 6 tablet 0  ? ?No facility-administered medications prior to visit.  ? ? ?No Known Allergies ? ?Review of Systems  ?Constitutional:  Negative for activity change and appetite change.  ?HENT:  Negative for congestion and dental problem.   ?Eyes:  Negative for visual disturbance.  ?Respiratory:  Negative for apnea and choking.   ?Cardiovascular:  Negative for chest pain, palpitations and leg swelling.  ?Gastrointestinal:  Negative for abdominal distention and abdominal pain.  ?Endocrine: Negative for polyuria.  ?Genitourinary:  Negative for difficulty urinating.  ?Musculoskeletal:  Negative for arthralgias and back pain.  ?Skin:  Negative for color change.  ?Neurological: Negative.   ?Psychiatric/Behavioral:  Positive for dysphoric mood.   ? ?   ?Objective:  ?  ?Physical Exam ?Vitals reviewed.  ?Constitutional:   ?   General: She is not in acute distress. ?   Appearance: Normal appearance.  ?HENT:  ?   Head: Normocephalic.  ?   Right Ear: Tympanic membrane  normal.  ?   Left Ear: Tympanic membrane normal.  ?   Mouth/Throat:  ?   Mouth: Mucous membranes are moist.  ?   Pharynx: Oropharynx is clear.  ?Eyes:  ?   Extraocular Movements: Extraocular movements intact.  ?   Conjunctiva/sclera: Conjunctivae normal.  ?   Pupils: Pupils are equal, round, and reactive to light.  ?Cardiovascular:  ?   Rate and Rhythm: Normal rate and regular rhythm.  ?   Pulses: Normal pulses.  ?   Heart sounds: Normal heart sounds. No murmur heard. ?  No gallop.  ?Pulmonary:  ?   Effort: Pulmonary effort is normal. No respiratory distress.  ?   Breath sounds: Normal breath sounds. No wheezing.  ?Abdominal:  ?   General: Abdomen is flat. Bowel  sounds are normal. There is no distension.  ?   Palpations: Abdomen is soft.  ?   Tenderness: There is no abdominal tenderness.  ?Musculoskeletal:     ?   General: Normal range of motion.  ?   Right lower leg: No edema.  ?   Left lower leg: No edema.  ?Skin: ?   General: Skin is warm and dry.  ?   Capillary Refill: Capillary refill takes less than 2 seconds.  ?Neurological:  ?   Mental Status: She is alert.  ? ? ?BP 110/70 (BP Location: Left Arm, Patient Position: Sitting, Cuff Size: Large)   Pulse 97   Temp 98.3 ?F (36.8 ?C)   Resp 16   Wt 141 lb 9.6 oz (64.2 kg)   SpO2 98%   BMI 25.90 kg/m?  ?Wt Readings from Last 3 Encounters:  ?09/07/21 141 lb 9.6 oz (64.2 kg)  ?08/26/20 150 lb (68 kg)  ? ? ?Health Maintenance Due  ?Topic Date Due  ? Hepatitis C Screening  Never done  ? TETANUS/TDAP  Never done  ? PAP SMEAR-Modifier  Never done  ? COLONOSCOPY (Pts [phone] Insurance coverage will need to be confirmed)  Never done  ? COVID-19 Vaccine (4 - Booster for Pfizer series) 07/24/2020  ? Zoster Vaccines- Shingrix (1 of 2) Never done  ? ? ?There are no preventive care reminders to display for this patient. ? ? ?No results found for: TSH ?Lab Results  ?Component Value Date  ? WBC 11.4 (H) 01/18/2007  ? HGB 8.1 (L) 01/18/2007  ? HCT 23.4 (L) 01/18/2007  ? MCV 89.4 01/18/2007  ? PLT 137 (L) 01/18/2007  ? ?Lab Results  ?Component Value Date  ? NA 137 01/18/2007  ? K 3.7 01/18/2007  ? CO2 25 01/18/2007  ? GLUCOSE 88 01/18/2007  ? BUN 7 01/18/2007  ? CREATININE 0.84 01/18/2007  ? BILITOT 0.5 01/18/2007  ? ALKPHOS 107 01/18/2007  ? AST 48 (H) 01/18/2007  ? ALT 69 (H) 01/18/2007  ? PROT 4.3 (L) 01/18/2007  ? ALBUMIN 1.7 (L) 01/18/2007  ? CALCIUM 8.0 (L) 01/18/2007  ? ?No results found for: CHOL ?No results found for: HDL ?No results found for: LDLCALC ?No results found for: TRIG ?No results found for: CHOLHDL ?No results found for: HGBA1C ? ?   ?Assessment & Plan:  ? ?Problem List Items Addressed This Visit   ? ?  ? Other  ?  Recurrent major depression-severe (HCC)  ? Relevant Medications  ? citalopram (CELEXA) 20 MG tablet  ? ARIPiprazole (ABILIFY) 2 MG tablet ?Patient has recurrent major depression she had been seen in the distant past with depression and did well with Celexa and counseling.  Since then she between 86 months of the year she is slowly been going downhill having more crying and despondency.  She is got to the point where she has great difficulty working and feels stressed just doing basic things.  She told one of her coworkers she was thinking of committing suicide.  And the cops were called which brought her to the emergency room for slowly she was seen by a representative of mental health who felt that she was fine to go home she has no plans for suicide has no guns she is very distraught that her sister is dying of metastatic breast cancer she has a boyfriend is very supportive and kids are doing well.  After long discussion we decided we will start her on Celexa and aripiprazole we will get her set up with: Counseling center I did tell her that she should not drive long distances or she needs to see her sister to have someone drive with her.  We will follow-up in 2 to 3 weeks  ? ?Other Visit Diagnoses   ? ? Acute low back pain, unspecified back pain laterality, unspecified whether sciatica present    -  Primary  ? Relevant Orders  ? POCT URINALYSIS DIP (CLINITEK) (Completed) ?Patient is having some low back pain which seems to be muscular urinalysis is totally normal she was reassured  ? ?  ? ? ? ? ? ? ?Brent Bulla, MD ? ?

## 2021-09-14 ENCOUNTER — Telehealth: Payer: Self-pay | Admitting: Legal Medicine

## 2021-09-14 NOTE — Telephone Encounter (Signed)
Pt called asking about getting mental health forms filled out and if she needed to schedule an appt. I asked her to fax the forms over for review to see what exactly is being required and we would be in touch with her rather if she needed an appt or pick up the forms.  ?

## 2021-09-15 ENCOUNTER — Telehealth: Payer: Self-pay

## 2021-09-15 NOTE — Telephone Encounter (Signed)
Patient called and stated that she needs a letter from work saying she is mentally able to go back to work. Job is wanting a letter from her provider because of her pervious actions wanting to self harm. Please advise. Does she need an appointment? ?

## 2021-09-15 NOTE — Telephone Encounter (Signed)
Best to see ?lp ?

## 2021-09-15 NOTE — Telephone Encounter (Signed)
Patient made aware.

## 2021-09-16 ENCOUNTER — Ambulatory Visit (INDEPENDENT_AMBULATORY_CARE_PROVIDER_SITE_OTHER): Payer: Managed Care, Other (non HMO) | Admitting: Legal Medicine

## 2021-09-16 ENCOUNTER — Encounter: Payer: Self-pay | Admitting: Legal Medicine

## 2021-09-16 VITALS — BP 130/70 | HR 73 | Temp 99.0°F | Resp 15 | Ht 62.0 in | Wt 140.0 lb

## 2021-09-16 DIAGNOSIS — F332 Major depressive disorder, recurrent severe without psychotic features: Secondary | ICD-10-CM | POA: Diagnosis not present

## 2021-09-16 NOTE — Progress Notes (Signed)
? ?Subjective:  ?Patient ID: Christina Lambert, female    DOB: 04-12-1971  Age: 51 y.o. MRN: [id number] ? ?Chief Complaint  ?Patient presents with  ? Depression  ? ? ?HPI: follow up ? Patient was seen on 09/07/2021 for severe recurrent depression. She mentioned that she could not take abilify for nauseas, but she is taking celexa  20 mg daily and she feels stable. She denied any suicide thoughts. ?She had telehealth visit at [facility] yesterday. No suicidal thought. She is less depression PHQ 9 is 8 now.  She is stable, she is off abilify unable to tolerate. She will follow up in group. Less crying. ? ?Current Outpatient Medications on File Prior to Visit  ?Medication Sig Dispense Refill  ? citalopram (CELEXA) 20 MG tablet Take 1 tablet (20 mg total) by mouth daily. 30 tablet 3  ? levonorgestrel (MIRENA) 20 MCG/24HR IUD by Intrauterine route.    ? ?No current facility-administered medications on file prior to visit.  ? ?Past Medical History:  ?Diagnosis Date  ? Gestational diabetes   ? 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 ?Past Surgical History:  ?Procedure Laterality Date  ? CESAREAN SECTION  01/15/2007  ?  ?Family History  ?Problem Relation Age of Onset  ? Bladder Cancer Father   ? Breast cancer Sister   ? Diabetes Mellitus I Other   ? ?Social History  ? ?Socioeconomic History  ? Marital status: Divorced  ?  Spouse name: Not on file  ? Number of children: 2  ? Years of education: Not on file  ? Highest education level: Some college, no degree  ?Occupational History  ? Occupation: Case worker  ?Tobacco Use  ? Smoking status: Former  ?  Types: Cigarettes  ?  Quit date: 06/2019  ?  Years since quitting: 2.2  ? Smokeless tobacco: Never  ?Vaping Use  ? Vaping Use: Never used  ?Substance and Sexual Activity  ? Alcohol use: Not Currently  ? Drug use: Never  ? Sexual activity: Yes  ?  Partners: Male  ?  Birth control/protection: I.U.D.  ?Other Topics Concern  ? Not on file  ?Social History Narrative  ? Not on file   ? ?Social Determinants of Health  ? ?Financial Resource Strain: Not on file  ?Food Insecurity: Not on file  ?Transportation Needs: Not on file  ?Physical Activity: Not on file  ?Stress: Not on file  ?Social Connections: Not on file  ? ? ?Review of Systems  ?Constitutional:  Negative for chills, fatigue and fever.  ?HENT:  Negative for congestion, ear pain and sore throat.   ?Eyes:  Negative for visual disturbance.  ?Respiratory:  Negative for cough and shortness of breath.   ?Cardiovascular:  Negative for chest pain and palpitations.  ?Gastrointestinal:  Negative for abdominal pain, constipation, diarrhea, nausea and vomiting.  ?Endocrine: Negative for polydipsia, polyphagia and polyuria.  ?Genitourinary:  Negative for difficulty urinating and dysuria.  ?Musculoskeletal:  Negative for arthralgias, back pain and myalgias.  ?Skin:  Negative for rash.  ?Neurological:  Negative for headaches.  ?Psychiatric/Behavioral:  Negative for agitation, confusion, decreased concentration, dysphoric mood, hallucinations, sleep disturbance and suicidal ideas. The patient is nervous/anxious.   ? ? ?Objective:  ?BP 130/70   Pulse 73   Temp 99 ?F (37.2 ?C)   Resp 15   Ht 5\' 2"  (1.575 m)   Wt 140 lb (63.5 kg)   SpO2 98%   BMI 25.61 kg/m?  ? ? ?  09/16/2021  ?  9:21 AM 09/07/2021  ?  1:58 PM 08/26/2020  ?  2:40 PM  ?BP/Weight  ?Systolic BP [id number]  ?Diastolic BP 70 70 80  ?Wt. (Lbs) 140 141.6 150  ?BMI 25.61 kg/m2 25.9 kg/m2 27.44 kg/m2  ? ? ?Physical Exam ?Vitals reviewed.  ?Constitutional:   ?   General: She is not in acute distress. ?   Appearance: Normal appearance.  ?HENT:  ?   Head: Normocephalic.  ?   Right Ear: Tympanic membrane normal.  ?   Left Ear: Tympanic membrane normal.  ?Cardiovascular:  ?   Rate and Rhythm: Normal rate and regular rhythm.  ?   Pulses: Normal pulses.  ?   Heart sounds: No murmur heard. ?  No gallop.  ?Pulmonary:  ?   Effort: Pulmonary effort is normal. No respiratory distress.  ?   Breath sounds:  Normal breath sounds. No wheezing.  ?Abdominal:  ?   General: Abdomen is flat. Bowel sounds are normal.  ?Musculoskeletal:  ?   Cervical back: Normal range of motion and neck supple.  ?Neurological:  ?   General: No focal deficit present.  ?   Mental Status: She is alert and oriented to person, place, and time.  ?Psychiatric:     ?   Mood and Affect: Mood normal.  ? ? ? ?  ? ?  09/16/2021  ?  9:34 AM 09/07/2021  ?  2:11 PM 08/26/2020  ?  3:43 PM  ?Depression screen PHQ 2/9  ?Decreased Interest 1 1 0  ?Down, Depressed, Hopeless 3 3 0  ?PHQ - 2 Score 4 4 0  ?Altered sleeping 0 2   ?Tired, decreased energy 1 1   ?Change in appetite 0 1   ?Feeling bad or failure about yourself  3 3   ?Trouble concentrating 0 3   ?Moving slowly or fidgety/restless 0 2   ?Suicidal thoughts 0 3   ?PHQ-9 Score 8 19   ?Difficult doing work/chores Somewhat difficult Somewhat difficult   ? ? ?Lab Results  ?Component Value Date  ? WBC 11.4 (H) 01/18/2007  ? HGB 8.1 (L) 01/18/2007  ? HCT 23.4 (L) 01/18/2007  ? PLT 137 (L) 01/18/2007  ? GLUCOSE 88 01/18/2007  ? ALT 69 (H) 01/18/2007  ? AST 48 (H) 01/18/2007  ? NA 137 01/18/2007  ? K 3.7 01/18/2007  ? CL 107 01/18/2007  ? CREATININE 0.84 01/18/2007  ? BUN 7 01/18/2007  ? CO2 25 01/18/2007  ? ? ? ? ?Assessment & Plan:  ? ?Diagnoses and all orders for this visit: ?Severe episode of recurrent major depressive disorder, without psychotic features ([place]) ?And has severe recurrent major depression disorder her original PHQ-9 was 19 she is now down to 8 and feeling much better and would like to return back to work she has talked with psychologist and will keep in regular group therapy.  She was unable to tolerate the aripiprazole but is continued on the citalopram.  She was given a note to return back to work next Monday and follow-up in 2 weeks. ? ? ? ?  ? ?Follow-up: Return in about 2 weeks (around 09/30/2021) for depression. ? ?An After Visit Summary was printed and given to the patient. ? ?Reinaldo Meeker,  MD ?[facility] ?[phone]

## 2021-09-22 ENCOUNTER — Telehealth: Payer: Self-pay

## 2021-09-22 NOTE — Telephone Encounter (Signed)
Decrease back to 10mg  dose for now ?lp ?

## 2021-09-22 NOTE — Telephone Encounter (Signed)
Christina Lambert called to report that the increased dose of Celexa 20 mg is causing severe nausea.  She also is experiencing drowsiness with the higher dose.  Please call [phone]. ?

## 2021-09-22 NOTE — Telephone Encounter (Signed)
Patient was informed.

## 2021-09-30 ENCOUNTER — Encounter: Payer: Self-pay | Admitting: Legal Medicine

## 2021-09-30 ENCOUNTER — Ambulatory Visit (INDEPENDENT_AMBULATORY_CARE_PROVIDER_SITE_OTHER): Payer: Managed Care, Other (non HMO) | Admitting: Legal Medicine

## 2021-09-30 VITALS — BP 118/68 | HR 72 | Temp 97.4°F | Resp 14 | Ht 62.0 in | Wt 141.0 lb

## 2021-09-30 DIAGNOSIS — F332 Major depressive disorder, recurrent severe without psychotic features: Secondary | ICD-10-CM

## 2021-09-30 NOTE — Progress Notes (Signed)
? ?Subjective:  ?Patient ID: Christina Lambert, female    DOB: 1970/09/05  Age: 52 y.o. MRN: [id number] ? ?Chief Complaint  ?Patient presents with  ? Depression  ? ? ?HPI: follow up ?Christina Lambert comes in for follow-up of depression.  She has improved since her last visit with the celexa 10 mg. Sister died and she feels relieved.  She is feeling better.started group therapy through [facility]. She has returned to work.Taking breaks. She is crying today, we discussed grief at length. ? ?Current Outpatient Medications on File Prior to Visit  ?Medication Sig Dispense Refill  ? citalopram (CELEXA) 20 MG tablet Take 1 tablet (20 mg total) by mouth daily. (Patient taking differently: Take 10 mg by mouth daily.) 30 tablet 3  ? levonorgestrel (MIRENA) 20 MCG/24HR IUD by Intrauterine route.    ? ?No current facility-administered medications on file prior to visit.  ? ?Past Medical History:  ?Diagnosis Date  ? Gestational diabetes   ? 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 ?Past Surgical History:  ?Procedure Laterality Date  ? CESAREAN SECTION  01/15/2007  ?  ?Family History  ?Problem Relation Age of Onset  ? Bladder Cancer Father   ? Breast cancer Sister   ? Diabetes Mellitus I Other   ? ?Social History  ? ?Socioeconomic History  ? Marital status: Divorced  ?  Spouse name: Not on file  ? Number of children: 2  ? Years of education: Not on file  ? Highest education level: Some college, no degree  ?Occupational History  ? Occupation: Case worker  ?Tobacco Use  ? Smoking status: Former  ?  Types: Cigarettes  ?  Quit date: 06/2019  ?  Years since quitting: 2.3  ? Smokeless tobacco: Never  ?Vaping Use  ? Vaping Use: Never used  ?Substance and Sexual Activity  ? Alcohol use: Not Currently  ? Drug use: Never  ? Sexual activity: Yes  ?  Partners: Male  ?  Birth control/protection: I.U.D.  ?Other Topics Concern  ? Not on file  ?Social History Narrative  ? Not on file  ? ?Social Determinants of Health  ? ?Financial Resource Strain: Not on file   ?Food Insecurity: Not on file  ?Transportation Needs: Not on file  ?Physical Activity: Not on file  ?Stress: Not on file  ?Social Connections: Not on file  ? ? ?Review of Systems  ?Constitutional:  Positive for fatigue. Negative for chills and fever.  ?HENT:  Negative for congestion.   ?Respiratory:  Negative for cough.   ?Cardiovascular:  Negative for chest pain.  ?Neurological:  Negative for dizziness and headaches.  ?Psychiatric/Behavioral:  Positive for dysphoric mood.   ? ? ?Objective:  ?BP 118/68   Pulse 72   Temp (!) 97.4 ?F (36.3 ?C)   Resp 14   Ht 5\' 2"  (1.575 m)   Wt 141 lb (64 kg)   BMI 25.79 kg/m?  ? ? ?  09/30/2021  ? 11:01 AM 09/16/2021  ?  9:21 AM 09/07/2021  ?  1:58 PM  ?BP/Weight  ?Systolic BP 118 130 110  ?Diastolic BP 68 70 70  ?Wt. (Lbs) 141 140 141.6  ?BMI 25.79 kg/m2 25.61 kg/m2 25.9 kg/m2  ? ? ?Physical Exam ?Vitals reviewed.  ?Constitutional:   ?   General: She is not in acute distress. ?   Appearance: Normal appearance.  ?HENT:  ?   Head: Normocephalic.  ?   Right Ear: Tympanic membrane normal.  ?   Left Ear: Tympanic membrane  normal.  ?   Nose: Nose normal.  ?   Mouth/Throat:  ?   Mouth: Mucous membranes are dry.  ?   Pharynx: Oropharynx is clear.  ?Eyes:  ?   Extraocular Movements: Extraocular movements intact.  ?   Conjunctiva/sclera: Conjunctivae normal.  ?   Pupils: Pupils are equal, round, and reactive to light.  ?Cardiovascular:  ?   Rate and Rhythm: Normal rate and regular rhythm.  ?   Pulses: Normal pulses.  ?   Heart sounds: No murmur heard. ?  No gallop.  ?Pulmonary:  ?   Effort: Pulmonary effort is normal. No respiratory distress.  ?   Breath sounds: Normal breath sounds. No wheezing.  ?Abdominal:  ?   General: Abdomen is flat. Bowel sounds are normal. There is no distension.  ?   Tenderness: There is no abdominal tenderness.  ?Musculoskeletal:     ?   General: Normal range of motion.  ?   Cervical back: Normal range of motion.  ?   Right lower leg: No edema.  ?   Left lower  leg: No edema.  ?Skin: ?   General: Skin is warm.  ?   Capillary Refill: Capillary refill takes less than 2 seconds.  ?Neurological:  ?   General: No focal deficit present.  ?   Mental Status: She is alert and oriented to person, place, and time. Mental status is at baseline.  ?   Gait: Gait normal.  ?   Deep Tendon Reflexes: Reflexes normal.  ?Psychiatric:     ?   Mood and Affect: Mood normal.     ?   Thought Content: Thought content normal.  ? ? ? ?  ? ?  09/30/2021  ? 11:05 AM 09/16/2021  ?  9:34 AM 09/07/2021  ?  2:11 PM 08/26/2020  ?  3:43 PM  ?Depression screen PHQ 2/9  ?Decreased Interest 1 1 1  0  ?Down, Depressed, Hopeless 1 3 3  0  ?PHQ - 2 Score 2 4 4  0  ?Altered sleeping 1 0 2   ?Tired, decreased energy 1 1 1    ?Change in appetite 0 0 1   ?Feeling bad or failure about yourself  1 3 3    ?Trouble concentrating 2 0 3   ?Moving slowly or fidgety/restless 0 0 2   ?Suicidal thoughts 1 0 3   ?PHQ-9 Score 8 8 19    ?Difficult doing work/chores Somewhat difficult Somewhat difficult Somewhat difficult   ? ? ? ?Lab Results  ?Component Value Date  ? WBC 11.4 (H) 01/18/2007  ? HGB 8.1 (L) 01/18/2007  ? HCT 23.4 (L) 01/18/2007  ? PLT 137 (L) 01/18/2007  ? GLUCOSE 88 01/18/2007  ? ALT 69 (H) 01/18/2007  ? AST 48 (H) 01/18/2007  ? NA 137 01/18/2007  ? K 3.7 01/18/2007  ? CL 107 01/18/2007  ? CREATININE 0.84 01/18/2007  ? BUN 7 01/18/2007  ? CO2 25 01/18/2007  ? ? ? ? ?Assessment & Plan:  ? ?Problem List Items Addressed This Visit   ? ?  ? Other  ? Recurrent major depression-severe (HCC) - Primary ?Patient's depression is improving with celexa.   Anhedonia better.  PHQ 9 was performed score 8. An individual care plan was established or reinforced today.  The patient's disease status was assessed using clinical findings on exam, labs, and or other diagnostic testing to determine patient's success in meeting treatment goals based on disease specific evidence-based guidelines and found to be improving ?Recommendations  include stay  on medicines   ?. ? ? ? ?  ? ?Follow-up: Return in about 2 months (around 11/30/2021) for depression. ? ?An After Visit Summary was printed and given to the patient. ? ?Brent Bulla, MD ?[facility] ?[phone]

## 2022-01-05 ENCOUNTER — Encounter: Payer: Self-pay | Admitting: Legal Medicine

## 2022-01-05 ENCOUNTER — Ambulatory Visit (INDEPENDENT_AMBULATORY_CARE_PROVIDER_SITE_OTHER): Payer: BC Managed Care – PPO | Admitting: Legal Medicine

## 2022-01-05 VITALS — BP 120/70 | HR 80 | Temp 98.7°F | Resp 14 | Ht 62.0 in | Wt 157.0 lb

## 2022-01-05 DIAGNOSIS — F332 Major depressive disorder, recurrent severe without psychotic features: Secondary | ICD-10-CM

## 2022-01-05 MED ORDER — CITALOPRAM HYDROBROMIDE 10 MG PO TABS: 10.0000 mg | ORAL_TABLET | Freq: Every day | ORAL | 1 refills | Status: DC

## 2022-01-05 NOTE — Progress Notes (Signed)
Subjective:  Patient ID: Christina Lambert, female    DOB: [birth year]  Age: 51 y.o. MRN: [id number]  Chief Complaint  Patient presents with   Depression    HPI Patient is here for Depression. She started Citalopram 10 mg daily since on 09/30/2021 and she feels is helping. She is having orgastric problems on medicines, we discussed a change.  Current Outpatient Medications on File Prior to Visit  Medication Sig Dispense Refill   citalopram (CELEXA) 20 MG tablet Take 1 tablet (20 mg total) by mouth daily. (Patient taking differently: Take 10 mg by mouth daily.) 30 tablet 3   levonorgestrel (MIRENA) 20 MCG/24HR IUD by Intrauterine route.     No current facility-administered medications on file prior to visit.   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    Past Surgical History:  Procedure Laterality Date   CESAREAN SECTION  01/15/2007    Family History  Problem Relation Age of Onset   Bladder Cancer Father    Breast cancer Sister    Diabetes Mellitus I Other    Social History   Socioeconomic History   Marital status: Divorced    Spouse name: Not on file   Number of children: 2   Years of education: Not on file   Highest education level: Some college, no degree  Occupational History   Occupation: Case worker  Tobacco Use   Smoking status: Former    Types: Cigarettes    Quit date: 06/2019    Years since quitting: 2.6   Smokeless tobacco: Never  Vaping Use   Vaping Use: Never used  Substance and Sexual Activity   Alcohol use: Not Currently   Drug use: Never   Sexual activity: Yes    Partners: Male    Birth control/protection: I.U.D.  Other Topics Concern   Not on file  Social History Narrative   Not on file   Social Determinants of Health   Financial Resource Strain: Not on file  Food Insecurity: Not on file  Transportation Needs: Not on file  Physical Activity: Not on file  Stress: Not on file   Social Connections: Not on file    Review of Systems  Constitutional:  Negative for chills, fatigue and fever.  HENT:  Negative for congestion, ear pain and sore throat.   Respiratory:  Negative for cough and shortness of breath.   Cardiovascular:  Negative for chest pain and palpitations.  Gastrointestinal:  Positive for constipation. Negative for abdominal pain, diarrhea, nausea and vomiting.  Endocrine: Negative for polydipsia, polyphagia and polyuria.  Genitourinary:  Negative for difficulty urinating and dysuria.  Musculoskeletal:  Negative for arthralgias, back pain and myalgias.  Skin:  Negative for rash.  Neurological:  Negative for headaches.  Psychiatric/Behavioral:  Negative for dysphoric mood. The patient is not nervous/anxious.      Objective:  BP 120/70   Pulse 80   Temp 98.7 F (37.1 C)   Resp 14   Ht 5\' 2"  (1.575 m)   Wt 157 lb (71.2 kg)   SpO2 97%   BMI 28.72 kg/m      01/05/2022    3:05 PM 09/30/2021   11:01 AM 09/16/2021    9:21 AM  BP/Weight  Systolic BP 120 118 130  Diastolic BP 70 68 70  Wt. (Lbs) 157 141 140  BMI 28.72 kg/m2 25.79 kg/m2 25.61 kg/m2    Physical Exam Vitals reviewed.  Constitutional:  General: She is not in acute distress.    Appearance: Normal appearance.  HENT:     Head: Normocephalic and atraumatic.     Right Ear: Tympanic membrane normal.     Left Ear: Tympanic membrane normal.     Nose: Nose normal.     Mouth/Throat:     Mouth: Mucous membranes are moist.     Pharynx: Oropharynx is clear.  Eyes:     Extraocular Movements: Extraocular movements intact.     Conjunctiva/sclera: Conjunctivae normal.     Pupils: Pupils are equal, round, and reactive to light.  Cardiovascular:     Rate and Rhythm: Normal rate and regular rhythm.     Pulses: Normal pulses.     Heart sounds: Normal heart sounds. No murmur heard.    No gallop.  Pulmonary:     Effort: Pulmonary effort is normal. No respiratory distress.     Breath  sounds: Normal breath sounds. No wheezing.  Abdominal:     General: Abdomen is flat. Bowel sounds are normal. There is no distension.     Palpations: Abdomen is soft.     Tenderness: There is no abdominal tenderness.  Musculoskeletal:        General: Normal range of motion.     Cervical back: Normal range of motion and neck supple.     Right lower leg: No edema.     Left lower leg: No edema.  Skin:    General: Skin is warm.     Capillary Refill: Capillary refill takes less than 2 seconds.  Neurological:     General: No focal deficit present.     Mental Status: She is alert and oriented to person, place, and time. Mental status is at baseline.  Psychiatric:        Mood and Affect: Mood normal.        Thought Content: Thought content normal.        Judgment: Judgment normal.       01/05/2022    3:18 PM 09/30/2021   11:05 AM 09/16/2021    9:34 AM 09/07/2021    2:11 PM 08/26/2020    3:43 PM  Depression screen PHQ 2/9  Decreased Interest 1 1 1 1  0  Down, Depressed, Hopeless 1 1 3 3  0  PHQ - 2 Score 2 2 4 4  0  Altered sleeping 0 1 0 2   Tired, decreased energy 1 1 1 1    Change in appetite 0 0 0 1   Feeling bad or failure about yourself  1 1 3 3    Trouble concentrating 0 2 0 3   Moving slowly or fidgety/restless 0 0 0 2   Suicidal thoughts 1 1 0 3   PHQ-9 Score 5 8 8 19    Difficult doing work/chores Not difficult at all Somewhat difficult Somewhat difficult Somewhat difficult          Lab Results  Component Value Date   WBC 11.4 (H) 01/18/2007   HGB 8.1 (L) 01/18/2007   HCT 23.4 (L) 01/18/2007   PLT 137 (L) 01/18/2007   GLUCOSE 88 01/18/2007   ALT 69 (H) 01/18/2007   AST 48 (H) 01/18/2007   NA 137 01/18/2007   K 3.7 01/18/2007   CL 107 01/18/2007   CREATININE 0.84 01/18/2007   BUN 7 01/18/2007   CO2 25 01/18/2007      Assessment & Plan:   Problem List Items Addressed This Visit       Other  Recurrent major depression-severe ([place]) - Primary   Relevant  Medications   citalopram (CELEXA) 10 MG tablet Change to trintellix 5mg  samples # 28  .samples trintellix  Meds ordered this encounter  Medications   citalopram (CELEXA) 10 MG tablet    Sig: Take 1 tablet (10 mg total) by mouth daily.    Dispense:  90 tablet    Refill:  1       Follow-up: Return in about 2 weeks (around 01/19/2022).  An After Visit Summary was printed and given to the patient.  Reinaldo Meeker, MD [facility] [phone]

## 2022-01-20 ENCOUNTER — Encounter: Payer: Self-pay | Admitting: Legal Medicine

## 2022-01-20 ENCOUNTER — Ambulatory Visit (INDEPENDENT_AMBULATORY_CARE_PROVIDER_SITE_OTHER): Payer: BC Managed Care – PPO | Admitting: Legal Medicine

## 2022-01-20 VITALS — BP 124/70 | HR 67 | Temp 98.1°F | Resp 14 | Ht 62.0 in | Wt 158.0 lb

## 2022-01-20 DIAGNOSIS — Z1211 Encounter for screening for malignant neoplasm of colon: Secondary | ICD-10-CM

## 2022-01-20 DIAGNOSIS — Z1159 Encounter for screening for other viral diseases: Secondary | ICD-10-CM

## 2022-01-20 DIAGNOSIS — Z0000 Encounter for general adult medical examination without abnormal findings: Secondary | ICD-10-CM

## 2022-01-20 DIAGNOSIS — F332 Major depressive disorder, recurrent severe without psychotic features: Secondary | ICD-10-CM | POA: Diagnosis not present

## 2022-01-20 MED ORDER — VORTIOXETINE HBR 5 MG PO TABS: 5.0000 mg | ORAL_TABLET | Freq: Every day | ORAL | 2 refills | Status: DC

## 2022-01-20 NOTE — Progress Notes (Signed)
Subjective:  Patient ID: Shadee Rathod, female    DOB: 1970/10/16  Age: 51 y.o. MRN: [id number]  Chief Complaint  Patient presents with   Annual Exam   Depression    HPI Well Adult Physical: Patient here for a comprehensive physical exam.The patient reports depression Do you take any herbs or supplements that were not prescribed by a doctor? no Are you taking calcium supplements? no Are you taking aspirin daily? no  Encounter for general adult medical examination without abnormal findings  Physical ("At Risk" items are starred): Patient's last physical exam was 1 year ago .  Patient is not afflicted from Stress Incontinence and Urge Incontinence  Patient wears a seat belt, has smoke detectors, has carbon monoxide detectors, practices appropriate gun safety, and wears sunscreen with extended sun exposure. Dental Care: biannual cleanings, brushes and flosses daily. Ophthalmology/Optometry: Annual visit.  Hearing loss: none Vision impairments: none  Menarche: 51 years old Menstrual History: Irregular LMP: 3 months ago Pregnancy history: G2P2A1 Safe at home: yes Self breast exams: No.  Depression doing well on trintellix 5mg   Flowsheet Row Office Visit from 01/05/2022 in [facility]  PHQ-2 Total Score 2               Social Hx   Social History   Socioeconomic History   Marital status: Divorced    Spouse name: Not on file   Number of children: 2   Years of education: Not on file   Highest education level: Some college, no degree  Occupational History   Occupation: Case worker  Tobacco Use   Smoking status: Former    Types: Cigarettes    Quit date: 06/2019    Years since quitting: 2.6   Smokeless tobacco: Never  Vaping Use   Vaping Use: Never used  Substance and Sexual Activity   Alcohol use: Not Currently   Drug use: Never   Sexual activity: Yes    Partners: Male    Birth control/protection: I.U.D.  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  HENT:  Negative for congestion, ear pain and sore throat.   Eyes:  Negative for visual disturbance.  Respiratory:  Negative for cough and shortness of breath.   Cardiovascular:  Negative for chest pain and palpitations.  Gastrointestinal:  Negative for abdominal pain, constipation, diarrhea, nausea and vomiting.  Endocrine: Negative for polydipsia, polyphagia and polyuria.  Genitourinary:  Negative for difficulty urinating and dysuria.  Musculoskeletal:  Negative for arthralgias, back pain and myalgias.  Skin:  Negative for rash.  Neurological:  Negative for headaches.  Hematological: Negative.   Psychiatric/Behavioral:  Negative for dysphoric mood. The patient is not nervous/anxious.      Objective:  BP 124/70  Pulse 67   Temp 98.1 F (36.7 C)   Resp 14   Ht 5\' 2"  (1.575 m)   Wt 158 lb (71.7 kg)   SpO2 97%   BMI 28.90 kg/m      01/20/2022    9:48 AM 01/05/2022    3:05 PM 09/30/2021   11:01 AM  BP/Weight  Systolic BP 124 120 118  Diastolic BP 70 70 68  Wt. (Lbs) 158 157 141  BMI 28.9 kg/m2 28.72 kg/m2 25.79 kg/m2    Physical Exam Vitals reviewed.  Constitutional:      General: She is not in acute distress.    Appearance: Normal appearance.  HENT:     Head: Normocephalic.     Right Ear: Tympanic membrane normal.      Left Ear: Tympanic membrane normal.     Nose: Nose normal.     Mouth/Throat:     Mouth: Mucous membranes are moist.     Pharynx: Oropharynx is clear.  Eyes:     Extraocular Movements: Extraocular movements intact.     Conjunctiva/sclera: Conjunctivae normal.     Pupils: Pupils are equal, round, and reactive to light.  Cardiovascular:     Rate and Rhythm: Normal rate and regular rhythm.     Pulses: Normal pulses.     Heart sounds: Normal heart sounds. No murmur heard.    No gallop.  Pulmonary:     Effort: Pulmonary effort is normal. No respiratory distress.     Breath sounds: Normal breath sounds. No wheezing.  Abdominal:     General: Abdomen is flat. Bowel sounds are normal. There is no distension.     Palpations: Abdomen is soft.     Tenderness: There is no abdominal tenderness.  Musculoskeletal:        General: Normal range of motion.     Cervical back: Normal range of motion.     Right lower leg: No edema.     Left lower leg: No edema.  Skin:    General: Skin is warm.     Capillary Refill: Capillary refill takes less than 2 seconds.  Neurological:     General: No focal deficit present.     Mental Status: She is alert and oriented to person, place, and time. Mental status is at baseline.     Gait: Gait normal.  Psychiatric:        Mood and Affect: Mood normal.     Lab Results  Component Value Date   WBC 11.4 (H) 01/18/2007   HGB 8.1 (L) 01/18/2007   HCT 23.4 (L) 01/18/2007   PLT 137 (L) 01/18/2007   GLUCOSE 88 01/18/2007   ALT 69 (H) 01/18/2007   AST 48 (H) 01/18/2007   NA 137 01/18/2007   K 3.7 01/18/2007   CL 107 01/18/2007   CREATININE 0.84 01/18/2007   BUN 7 01/18/2007   CO2 25 01/18/2007      Assessment & Plan:   Problem List Items Addressed This Visit       Other   Recurrent major depression-severe ([facility])   Routine physical examination - Primary   Relevant Orders   Comprehensive metabolic panel   Hemoglobin A1c   Lipid panel   CBC with  Differential/Platelet   Other Visit Diagnoses     Need for hepatitis C screening test       Relevant Orders   Hepatitis C Antibody   Screening for colon cancer       Relevant Orders  Cologuard         Body mass index is 28.9 kg/m.   These are the goals we discussed:  Goals      Increase physical activity         This is a list of the screening recommended for you and due dates:  Health Maintenance  Topic Date Due   Hepatitis C Screening: USPSTF Recommendation to screen - Ages 27-79 yo.  Never done   Tetanus Vaccine  Never done   Pap Smear  Never done   Colon Cancer Screening  Never done   Zoster (Shingles) Vaccine (1 of 2) Never done   Flu Shot  12/29/2021   COVID-19 Vaccine (4 - Pfizer series) 10/03/2022*   Mammogram  04/28/2022   HIV Screening  Completed   HPV Vaccine  Aged Out  *Topic was postponed. The date shown is not the original due date.       Follow-up: No follow-ups on file.  An After Visit Summary was printed and given to the patient.  Brent Bulla, MD [facility] [phone]

## 2022-01-21 LAB — CBC WITH DIFFERENTIAL/PLATELET
Basophils Absolute: 0.1 10*3/uL (ref 0.0–0.2)
Basos: 1 %
EOS (ABSOLUTE): 0.1 10*3/uL (ref 0.0–0.4)
Eos: 2 %
Hematocrit: 40.3 % (ref 34.0–46.6)
Hemoglobin: 13.7 g/dL (ref 11.1–15.9)
Immature Grans (Abs): 0 10*3/uL (ref 0.0–0.1)
Immature Granulocytes: 0 %
Lymphocytes Absolute: 1.6 10*3/uL (ref 0.7–3.1)
Lymphs: 33 %
MCH: 30.2 pg (ref 26.6–33.0)
MCHC: 34 g/dL (ref 31.5–35.7)
MCV: 89 fL (ref 79–97)
Monocytes Absolute: 0.4 10*3/uL (ref 0.1–0.9)
Monocytes: 9 %
Neutrophils Absolute: 2.7 10*3/uL (ref 1.4–7.0)
Neutrophils: 55 %
Platelets: 244 10*3/uL (ref 150–450)
RBC: 4.53 x10E6/uL (ref 3.77–5.28)
RDW: 12.7 % (ref 11.7–15.4)
WBC: 4.8 10*3/uL (ref 3.4–10.8)

## 2022-01-21 LAB — COMPREHENSIVE METABOLIC PANEL
ALT: 17 IU/L (ref 0–32)
AST: 19 IU/L (ref 0–40)
Albumin/Globulin Ratio: 2.2 (ref 1.2–2.2)
Albumin: 5.1 g/dL — ABNORMAL HIGH (ref 3.8–4.9)
Alkaline Phosphatase: 67 IU/L (ref 44–121)
BUN/Creatinine Ratio: 17 (ref 9–23)
BUN: 11 mg/dL (ref 6–24)
Bilirubin Total: 0.4 mg/dL (ref 0.0–1.2)
CO2: 21 mmol/L (ref 20–29)
Calcium: 10 mg/dL (ref 8.7–10.2)
Chloride: 103 mmol/L (ref 96–106)
Creatinine, Ser: 0.65 mg/dL (ref 0.57–1.00)
Globulin, Total: 2.3 g/dL (ref 1.5–4.5)
Glucose: 108 mg/dL — ABNORMAL HIGH (ref 70–99)
Potassium: 4.5 mmol/L (ref 3.5–5.2)
Sodium: 144 mmol/L (ref 134–144)
Total Protein: 7.4 g/dL (ref 6.0–8.5)
eGFR: 107 mL/min/{1.73_m2} (ref >59)

## 2022-01-21 LAB — HEPATITIS C ANTIBODY: Hep C Virus Ab: NONREACTIVE

## 2022-01-21 LAB — LIPID PANEL
Chol/HDL Ratio: 3.6 ratio (ref 0.0–4.4)
Cholesterol, Total: 194 mg/dL (ref 100–199)
HDL: 54 mg/dL (ref >39)
LDL Chol Calc (NIH): 107 mg/dL — ABNORMAL HIGH (ref 0–99)
Triglycerides: 194 mg/dL — ABNORMAL HIGH (ref 0–149)
VLDL Cholesterol Cal: 33 mg/dL (ref 5–40)

## 2022-01-21 LAB — HEMOGLOBIN A1C
Est. average glucose Bld gHb Est-mCnc: 117 mg/dL
Hgb A1c MFr Bld: 5.7 % — ABNORMAL HIGH (ref 4.8–5.6)

## 2022-01-21 LAB — CARDIOVASCULAR RISK ASSESSMENT

## 2022-01-21 NOTE — Progress Notes (Signed)
A1c 5.7 prediabetes, nurse visit to start Dash diet and diabetes education, triglycerides 194 high, LDL cholesterol 107 high, DASH diet, glucose 108, kidney and liver tests normal, Hepatitis c neg, CBC normal,  lp

## 2022-01-23 NOTE — Patient Instructions (Signed)
Preventive Care 51-51 Years Old, Female Preventive care refers to lifestyle choices and visits with your health care provider that can promote health and wellness. Preventive care visits are also called wellness exams. What can I expect for my preventive care visit? Counseling Your health care provider may ask you questions about your: Medical history, including: Past medical problems. Family medical history. Pregnancy history. Current health, including: Menstrual cycle. Method of birth control. Emotional well-being. Home life and relationship well-being. Sexual activity and sexual health. Lifestyle, including: Alcohol, nicotine or tobacco, and drug use. Access to firearms. Diet, exercise, and sleep habits. Work and work environment. Sunscreen use. Safety issues such as seatbelt and bike helmet use. Physical exam Your health care provider will check your: Height and weight. These may be used to calculate your BMI (body mass index). BMI is a measurement that tells if you are at a healthy weight. Waist circumference. This measures the distance around your waistline. This measurement also tells if you are at a healthy weight and may help predict your risk of certain diseases, such as type 2 diabetes and high blood pressure. Heart rate and blood pressure. Body temperature. Skin for abnormal spots. What immunizations do I need?  Vaccines are usually given at various ages, according to a schedule. Your health care provider will recommend vaccines for you based on your age, medical history, and lifestyle or other factors, such as travel or where you work. What tests do I need? Screening Your health care provider may recommend screening tests for certain conditions. This may include: Lipid and cholesterol levels. Diabetes screening. This is done by checking your blood sugar (glucose) after you have not eaten for a while (fasting). Pelvic exam and Pap test. Hepatitis B test. Hepatitis C  test. HIV (human immunodeficiency virus) test. STI (sexually transmitted infection) testing, if you are at risk. Lung cancer screening. Colorectal cancer screening. Mammogram. Talk with your health care provider about when you should start having regular mammograms. This may depend on whether you have a family history of breast cancer. BRCA-related cancer screening. This may be done if you have a family history of breast, ovarian, tubal, or peritoneal cancers. Bone density scan. This is done to screen for osteoporosis. Talk with your health care provider about your test results, treatment options, and if necessary, the need for more tests. Follow these instructions at home: Eating and drinking  Eat a diet that includes fresh fruits and vegetables, whole grains, lean protein, and low-fat dairy products. Take vitamin and mineral supplements as recommended by your health care provider. Do not drink alcohol if: Your health care provider tells you not to drink. You are pregnant, may be pregnant, or are planning to become pregnant. If you drink alcohol: Limit how much you have to 0-1 drink a day. Know how much alcohol is in your drink. In the [place] one drink equals one 12 oz bottle of beer (355 mL), one 5 oz glass of wine (148 mL), or one 1 oz glass of hard liquor (44 mL). Lifestyle Brush your teeth every morning and night with fluoride toothpaste. Floss one time each day. Exercise for at least 30 minutes 5 or more days each week. Do not use any products that contain nicotine or tobacco. These products include cigarettes, chewing tobacco, and vaping devices, such as e-cigarettes. If you need help quitting, ask your health care provider. Do not use drugs. If you are sexually active, practice safe sex. Use a condom or other form of protection to   prevent STIs. If you do not wish to become pregnant, use a form of birth control. If you plan to become pregnant, see your health care provider for a  prepregnancy visit. Take aspirin only as told by your health care provider. Make sure that you understand how much to take and what form to take. Work with your health care provider to find out whether it is safe and beneficial for you to take aspirin daily. Find healthy ways to manage stress, such as: Meditation, yoga, or listening to music. Journaling. Talking to a trusted person. Spending time with friends and family. Minimize exposure to UV radiation to reduce your risk of skin cancer. Safety Always wear your seat belt while driving or riding in a vehicle. Do not drive: If you have been drinking alcohol. Do not ride with someone who has been drinking. When you are tired or distracted. While texting. If you have been using any mind-altering substances or drugs. Wear a helmet and other protective equipment during sports activities. If you have firearms in your house, make sure you follow all gun safety procedures. Seek help if you have been physically or sexually abused. What's next? Visit your health care provider once a year for an annual wellness visit. Ask your health care provider how often you should have your eyes and teeth checked. Stay up to date on all vaccines. This information is not intended to replace advice given to you by your health care provider. Make sure you discuss any questions you have with your health care provider. Document Revised: 11/12/2020 Document Reviewed: 11/12/2020 Elsevier Patient Education  [place].

## 2022-02-27 DIAGNOSIS — Z1211 Encounter for screening for malignant neoplasm of colon: Secondary | ICD-10-CM | POA: Diagnosis not present

## 2022-03-05 LAB — COLOGUARD: COLOGUARD: NEGATIVE

## 2022-03-07 NOTE — Progress Notes (Signed)
Cologard negative, repeat 3 years lp

## 2022-04-19 NOTE — Progress Notes (Unsigned)
Subjective:  Patient ID: Christina Lambert, female    DOB: 24-Jan-1971  Age: 51 y.o. MRN: [id number]  Chief Complaint  Patient presents with   Depression    HPI: she is doing well on trintellix.  But expensive.   Patient is only taking Trintellix 5 mg daily. Emotions are doing well and better sexuALITY. Current Outpatient Medications on File Prior to Visit  Medication Sig Dispense Refill   levonorgestrel (MIRENA) 20 MCG/24HR IUD by Intrauterine route.     No current facility-administered medications on file prior to visit.   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    Past Surgical History:  Procedure Laterality Date   CESAREAN SECTION  01/15/2007    Family History  Problem Relation Age of Onset   Bladder Cancer Father    Breast cancer Sister    Diabetes Mellitus I Other    Social History   Socioeconomic History   Marital status: Divorced    Spouse name: Not on file   Number of children: 2   Years of education: Not on file   Highest education level: Some college, no degree  Occupational History   Occupation: Case worker  Tobacco Use   Smoking status: Former    Types: Cigarettes    Quit date: 06/2019    Years since quitting: 2.8   Smokeless tobacco: Never  Vaping Use   Vaping Use: Never used  Substance and Sexual Activity   Alcohol use: Not Currently   Drug use: Never   Sexual activity: Yes    Partners: Male    Birth control/protection: I.U.D.  Other Topics Concern   Not on file  Social History Narrative   Not on file   Social Determinants of Health   Financial Resource Strain: Not on file  Food Insecurity: Not on file  Transportation Needs: Not on file  Physical Activity: Not on file  Stress: Not on file  Social Connections: Not on file    Review of Systems  Constitutional:  Negative for chills and fatigue.  HENT:  Negative for congestion, ear pain, sinus pain and sore throat.   Eyes:   Negative for visual disturbance.  Respiratory:  Negative for cough and shortness of breath.   Cardiovascular:  Negative for chest pain and leg swelling.  Gastrointestinal:  Negative for abdominal pain, constipation, diarrhea, nausea and vomiting.  Endocrine: Negative.   Genitourinary:  Negative for dysuria and frequency.  Musculoskeletal:  Negative for arthralgias, back pain and myalgias.  Skin: Negative.   Neurological:  Negative for dizziness and headaches.  Psychiatric/Behavioral: Negative.       Objective:  BP 122/82   Pulse 79   Temp (!) 97.2 F (36.2 C)   Resp 16   Ht 5\' 2"  (1.575 m)   Wt 164 lb 8 oz (74.6 kg)   SpO2 99%   BMI 30.09 kg/m      04/20/2022    9:26 AM 01/20/2022    9:48 AM 01/05/2022    3:05 PM  BP/Weight  Systolic BP 122 124 120  Diastolic BP 82 70 70  Wt. (Lbs) 164.5 158 157  BMI 30.09 kg/m2 28.9 kg/m2 28.72 kg/m2    Physical Exam Vitals reviewed.  Constitutional:      General: She is not in acute distress.    Appearance: Normal appearance.  HENT:     Head: Normocephalic.     Right Ear: Tympanic membrane normal.  Left Ear: Tympanic membrane normal.     Nose: Nose normal.     Mouth/Throat:     Mouth: Mucous membranes are moist.     Pharynx: Oropharynx is clear.  Eyes:     Extraocular Movements: Extraocular movements intact.     Conjunctiva/sclera: Conjunctivae normal.     Pupils: Pupils are equal, round, and reactive to light.  Cardiovascular:     Rate and Rhythm: Normal rate and regular rhythm.     Pulses: Normal pulses.     Heart sounds: Normal heart sounds. No murmur heard.    No gallop.  Pulmonary:     Effort: Pulmonary effort is normal. No respiratory distress.     Breath sounds: Normal breath sounds. No wheezing.  Abdominal:     General: Abdomen is flat. Bowel sounds are normal. There is no distension.     Palpations: Abdomen is soft.     Tenderness: There is no abdominal tenderness.  Musculoskeletal:        General: Normal  range of motion.     Cervical back: Normal range of motion.  Skin:    General: Skin is warm.     Capillary Refill: Capillary refill takes less than 2 seconds.  Neurological:     General: No focal deficit present.     Mental Status: She is alert and oriented to person, place, and time. Mental status is at baseline.         Lab Results  Component Value Date   WBC 4.8 01/20/2022   HGB 13.7 01/20/2022   HCT 40.3 01/20/2022   PLT 244 01/20/2022   GLUCOSE 108 (H) 01/20/2022   CHOL 194 01/20/2022   TRIG 194 (H) 01/20/2022   HDL 54 01/20/2022   LDLCALC 107 (H) 01/20/2022   ALT 17 01/20/2022   AST 19 01/20/2022   NA 144 01/20/2022   K 4.5 01/20/2022   CL 103 01/20/2022   CREATININE 0.65 01/20/2022   BUN 11 01/20/2022   CO2 21 01/20/2022   HGBA1C 5.7 (H) 01/20/2022      04/20/2022   10:06 AM 04/20/2022    9:30 AM 01/05/2022    3:18 PM 09/30/2021   11:05 AM 09/16/2021    9:34 AM  Depression screen PHQ 2/9  Decreased Interest 0 1 1 1 1   Down, Depressed, Hopeless 1 1 1 1 3   PHQ - 2 Score 1 2 2 2 4   Altered sleeping 0 0 0 1 0  Tired, decreased energy 0 0 1 1 1   Change in appetite 0 0 0 0 0  Feeling bad or failure about yourself  0 0 1 1 3   Trouble concentrating 0 0 0 2 0  Moving slowly or fidgety/restless 0 0 0 0 0  Suicidal thoughts  1 1 1  0  PHQ-9 Score 1 3 5 8 8   Difficult doing work/chores Somewhat difficult Very difficult Not difficult at all Somewhat difficult Somewhat difficult      Assessment & Plan:   Problem List Items Addressed This Visit       Other   Recurrent major depression-severe (HCC) - Primary   Relevant Medications   vortioxetine HBr (TRINTELLIX) 5 MG TABS tablet Depression going well PHQ-9 is 2, no sexual difficulties. Continue medicine.  .       Follow-up: Return in about 3 months (around 07/21/2022).  An After Visit Summary was printed and given to the patient.  , MD [facility] [phone]

## 2022-04-20 ENCOUNTER — Encounter: Payer: Self-pay | Admitting: Legal Medicine

## 2022-04-20 ENCOUNTER — Ambulatory Visit (INDEPENDENT_AMBULATORY_CARE_PROVIDER_SITE_OTHER): Payer: BC Managed Care – PPO | Admitting: Legal Medicine

## 2022-04-20 VITALS — BP 122/82 | HR 79 | Temp 97.2°F | Resp 16 | Ht 62.0 in | Wt 164.5 lb

## 2022-04-20 DIAGNOSIS — F332 Major depressive disorder, recurrent severe without psychotic features: Secondary | ICD-10-CM

## 2022-04-20 MED ORDER — VORTIOXETINE HBR 5 MG PO TABS: 5.0000 mg | ORAL_TABLET | Freq: Every day | ORAL | 2 refills | Status: AC

## 2023-11-16 DIAGNOSIS — F4323 Adjustment disorder with mixed anxiety and depressed mood: Secondary | ICD-10-CM | POA: Diagnosis not present

## 2023-12-01 DIAGNOSIS — H5203 Hypermetropia, bilateral: Secondary | ICD-10-CM | POA: Diagnosis not present

## 2023-12-20 DIAGNOSIS — F4323 Adjustment disorder with mixed anxiety and depressed mood: Secondary | ICD-10-CM | POA: Diagnosis not present

## 2024-01-17 DIAGNOSIS — F4323 Adjustment disorder with mixed anxiety and depressed mood: Secondary | ICD-10-CM | POA: Diagnosis not present

## 2024-02-14 DIAGNOSIS — F4323 Adjustment disorder with mixed anxiety and depressed mood: Secondary | ICD-10-CM | POA: Diagnosis not present

## 2024-03-13 DIAGNOSIS — F4323 Adjustment disorder with mixed anxiety and depressed mood: Secondary | ICD-10-CM | POA: Diagnosis not present

## 2024-05-01 DIAGNOSIS — F4323 Adjustment disorder with mixed anxiety and depressed mood: Secondary | ICD-10-CM | POA: Diagnosis not present
